# Patient Record
Sex: Female | Born: 1985 | Race: Black or African American | Hispanic: No | Marital: Single | Smoking: Former smoker
Health system: Southern US, Community
[De-identification: ages and names within clinical notes are randomized; demographics above are authoritative.]

## PROBLEM LIST (undated history)

## (undated) ENCOUNTER — Inpatient Hospital Stay (HOSPITAL_COMMUNITY): Payer: Self-pay

## (undated) DIAGNOSIS — I10 Essential (primary) hypertension: Secondary | ICD-10-CM

## (undated) DIAGNOSIS — O009 Unspecified ectopic pregnancy without intrauterine pregnancy: Secondary | ICD-10-CM

---
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
## 2006-11-30 ENCOUNTER — Encounter (INDEPENDENT_AMBULATORY_CARE_PROVIDER_SITE_OTHER): Payer: Self-pay | Admitting: Specialist

## 2006-11-30 ENCOUNTER — Inpatient Hospital Stay (HOSPITAL_COMMUNITY): Admission: AD | Admit: 2006-11-30 | Discharge: 2006-12-02 | Payer: Self-pay | Admitting: Obstetrics and Gynecology

## 2008-06-27 ENCOUNTER — Other Ambulatory Visit: Admission: RE | Admit: 2008-06-27 | Discharge: 2008-06-27 | Payer: Self-pay | Admitting: Obstetrics and Gynecology

## 2010-01-30 ENCOUNTER — Other Ambulatory Visit: Admission: RE | Admit: 2010-01-30 | Discharge: 2010-01-30 | Payer: Self-pay | Admitting: Obstetrics and Gynecology

## 2011-02-07 NOTE — Group Therapy Note (Signed)
[name], Cristina [name]               ACCOUNT NO.:  [id number]   MEDICAL RECORD NO.:  [id number]          PATIENT TYPE:  INP   LOCATION:  LDR3                          FACILITY:  [facility]   PHYSICIAN:  Tilda Burrow, M.[name]. DATE OF BIRTH:  07-06-1986   DATE OF PROCEDURE:  DATE OF DISCHARGE:                                 PROGRESS NOTE   DELIVERY SUMMARY:   ONSET OF LABOR:  November 30, 2006.   DATE OF DELIVERY:  November 30, 2006 at 1808 hours.   LENGTH OF FIRST STAGE OF LABOR:  Six hours and 48 minutes.   LENGTH OF SECOND STAGE OF LABOR:  Twenty minutes.   LENGTH OF THIRD STAGE OF LABOR:  Four minutes.   DELIVERY NOTE:  Cristina [name] had a vacuum-assisted delivery of a viable female  infant under epidural anesthesia for prolonged decelerations.  Infant  was delivered OP.  Upon delivery of head, nose and mouth were thoroughly  suctioned with a DeLee suction due to light meconium staining.  Cord was  reduced and infant delivered without any difficulties.  Apgar's were 9  and 9.  On inspection there is a second degree perineal laceration which  was repaired with 2-0 Vicryl under epidural anesthesia, per Dr.  Emelda Fear, with 2-0 Vicryl.  The third stage of labor was actively  managed with 20 units Pitocin and 1,000 cc of [name]-5 LR at a rapid rate.  Placenta was delivered spontaneously via Tomasa Blase mechanism.  Three  vessel cord was noted upon inspection.  Membranes were noted to be  intact upon  inspection.  Cord blood gas and cord blood was obtained.  Placenta sent  to pathology for evaluation due to meconium staining.  Estimated blood  loss was approximately 350 cc.  Epidural catheter was removed with blue  tip intact.  Infant and mother stabilized and transferred out to the  post partum unit in stable condition.      Cristina [name], Cristina [name]      Tilda Burrow, M.[name].  Electronically Signed    DL/MEDQ  [name]:  96/12/5407  T:  11/30/2006  Job:  [id number]   cc:   [facility]   Francoise Schaumann.  Milford Cage DO, FAAP  Fax: [phone]

## 2011-02-07 NOTE — Op Note (Signed)
NAME:  Cristina [name], Cristina [name]               ACCOUNT NO.:  [id number]   MEDICAL RECORD NO.:  [id number]          PATIENT TYPE:  INP   LOCATION:  LDR3                          FACILITY:  [facility]   PHYSICIAN:  Tilda Burrow, M.D. DATE OF BIRTH:  Aug 10, 1986   DATE OF PROCEDURE:  DATE OF DISCHARGE:                               OPERATIVE REPORT   Epidural catheter placement at 2 p.m. on the 10th.  Continuous lumbar  epidural catheter placed at L2-3 interspace  and attempted at L3-4  without success.  Patient at first been prepped and draped, sitting and  flexed forward with local anesthesia used at L3-4.  The bony interspace  could not be identified and bony obstruction of approximately 4-5 cm  depth encountered.  A wheal was placed at the interspace.  Our first  attempt identified the epidural space at 6 cm of depth.  We gave a 3 cc  injection of 0.5% Xylocaine with epinephrine.  This catheter _was  threaded 2 cm into___ the epidural space.  Gave an additional 3 cc test  dose of Xylocaine with epi and maternal blood pressure remained normal  and pulse stable.  The patient then had infusion of a bolus of 7 cc of  0.125% Marcaine solution followed by 14 cc per hour.  The patient had  steady analgesic effect associated with sitting up symmetrically and is  checked post procedure.  The cervix is 3 cm, 90%, -2, vertex well  applied to a fairly firm, _posterior__ cervix.   PROGNOSIS:  Routine vaginal delivery considered likely.      Tilda Burrow, M.D.  Electronically Signed     JVF/MEDQ  D:  11/30/2006  T:  11/30/2006  Job:  [id number]

## 2011-02-07 NOTE — Op Note (Signed)
NAMEJEANNETT, DEKONING               ACCOUNT NO.:  [id number]   MEDICAL RECORD NO.:  [id number]          PATIENT TYPE:  INP   LOCATION:  A401                          FACILITY:  [facility]   PHYSICIAN:  Tilda Burrow, M.D. DATE OF BIRTH:  Mar 04, 1986   DATE OF PROCEDURE:  11/30/2006  DATE OF DISCHARGE:  12/02/2006                               OPERATIVE REPORT   PROCEDURE:  Epidural catheter placement operative report.   DETAILS:  Epidural catheter placement at 2:00 p.m. on the 10th and  continuous lumbar epidural catheter placed at the L2-3 interspace after  an attempted at L3-4 was without success.  The patient had first been  prepped and draped, in the sitting position flexed forward with local  anesthesia used at L3-4, then again, at L2-3 when that was necessary.  The bony interspace at L3-4 could not be identified with bony  obstruction identified at 4-5 cm depth beneath the skin.  A wheal was  then placed at the L2-3 interspace; and the first attempt identified the  epidural space at 6 cm depth beneath the skin.   A 3 mL injection of 0.5% lidocaine with epinephrine was performed, then  the catheter threaded into the epidural space 3 cm.  There was an  additional 3 mL test dose of Xylocaine with epinephrine given; and  maternal blood pressure remained normal.  The patient then had infusion  of a 7 mL bolus of 0.125% Marcaine solution followed by continuous  infusion at 14 cc/hour.  The patient had symmetric analgesic effect.   Cervical exam after the epidural shows the cervix to be 3 cm 90%, minus  2 vertex presentation with the presenting part well applied to the  cervix.      Tilda Burrow, M.D.  Electronically Signed     JVF/MEDQ  D:  12/14/2006  T:  12/14/2006  Job:  [id number]

## 2011-02-07 NOTE — H&P (Signed)
NAME:  Cristina [name], Cristina [name]               ACCOUNT NO.:  [id number]   MEDICAL RECORD NO.:  [id number]          PATIENT TYPE:  INP   LOCATION:  LDR3                          FACILITY:  [facility]   PHYSICIAN:  Tilda Burrow, M.D. DATE OF BIRTH:  02-24-1986   DATE OF ADMISSION:  11/30/2006  DATE OF DISCHARGE:  LH                              HISTORY & PHYSICAL   REASON FOR ADMISSION:  Pregnancy at 39 weeks with spontaneous rupture of  membranes at 11:30 a.m. which was meconium stained.   PAST MEDICAL HISTORY:  Negative.   PAST SURGICAL HISTORY:  Negative.   FAMILY HISTORY:  Positive for hypertension and pancreatic cancer.   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  Blood  type is O positive.  UDS is negative.  Rubella immune.  Hepatitis B  surface antigen negative.  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  GC/Chlamydia negative.  A 28-week hemoglobin 11.2, 28-week  hematocrit 33.4, one hour glucose 134, sickle cell screen is negative.   PHYSICAL EXAMINATION:  VITAL SIGNS:  Stable.  CERVIX:  3 cm, 90% effaced, -1 station.  There is a moderate amount of  mucousy meconium noted with exam.  FSE and IUPC was applied without  difficulty, and we are going to start amnio infusion at 150 cc an hour  and antibiotics for GBS prophylaxis because GBS was not obtained, and  Pitocin augmentation of labor.      Zerita Boers, Lanier Clam      Tilda Burrow, M.D.  Electronically Signed   DL/MEDQ  D:  81/19/1478  T:  11/30/2006  Job:  [id number]   cc:   Francoise Schaumann. Raynelle Highland  Fax: [phone]   Tilda Burrow, M.D.  Fax: [phone]

## 2015-01-01 ENCOUNTER — Other Ambulatory Visit: Payer: Self-pay | Admitting: *Deleted

## 2015-01-01 ENCOUNTER — Ambulatory Visit (HOSPITAL_COMMUNITY): Payer: Medicaid Other | Admitting: Anesthesiology

## 2015-01-01 ENCOUNTER — Encounter (HOSPITAL_COMMUNITY): Payer: Self-pay | Admitting: *Deleted

## 2015-01-01 ENCOUNTER — Ambulatory Visit (HOSPITAL_COMMUNITY)
Admission: RE | Admit: 2015-01-01 | Discharge: 2015-01-01 | Disposition: A | Discharge status: Home / Self Care | Payer: Medicaid Other | Source: Ambulatory Visit | Attending: Obstetrics & Gynecology | Admitting: Obstetrics & Gynecology

## 2015-01-01 ENCOUNTER — Ambulatory Visit (INDEPENDENT_AMBULATORY_CARE_PROVIDER_SITE_OTHER): Payer: Medicaid Other

## 2015-01-01 ENCOUNTER — Encounter (HOSPITAL_COMMUNITY)
Admission: RE | Disposition: A | Discharge status: Home / Self Care | Payer: Self-pay | Source: Ambulatory Visit | Attending: Obstetrics & Gynecology

## 2015-01-01 ENCOUNTER — Ambulatory Visit (INDEPENDENT_AMBULATORY_CARE_PROVIDER_SITE_OTHER): Payer: Medicaid Other | Admitting: Obstetrics & Gynecology

## 2015-01-01 DIAGNOSIS — O00109 Unspecified tubal pregnancy without intrauterine pregnancy: Secondary | ICD-10-CM

## 2015-01-01 DIAGNOSIS — O009 Ectopic pregnancy, unspecified: Secondary | ICD-10-CM | POA: Diagnosis not present

## 2015-01-01 DIAGNOSIS — Z87891 Personal history of nicotine dependence: Secondary | ICD-10-CM | POA: Diagnosis not present

## 2015-01-01 DIAGNOSIS — O3680X1 Pregnancy with inconclusive fetal viability, fetus 1: Secondary | ICD-10-CM

## 2015-01-01 DIAGNOSIS — O001 Tubal pregnancy: Secondary | ICD-10-CM | POA: Diagnosis not present

## 2015-01-01 HISTORY — PX: LAPAROSCOPIC UNILATERAL SALPINGECTOMY: SHX5934

## 2015-01-01 LAB — CBC
HCT: 35.4 % — ABNORMAL LOW (ref 36.0–46.0)
Hemoglobin: 12.4 g/dL (ref 12.0–15.0)
MCH: 31.2 pg (ref 26.0–34.0)
MCHC: 35 g/dL (ref 30.0–36.0)
MCV: 89.2 fL (ref 78.0–100.0)
Platelets: 224 10*3/uL (ref 150–400)
RBC: 3.97 MIL/uL (ref 3.87–5.11)
RDW: 12.1 % (ref 11.5–15.5)
WBC: 6.7 10*3/uL (ref 4.0–10.5)

## 2015-01-01 LAB — TYPE AND SCREEN
ABO/RH(D): O POS
ANTIBODY SCREEN: NEGATIVE

## 2015-01-01 LAB — URINALYSIS, ROUTINE W REFLEX MICROSCOPIC
Bilirubin Urine: NEGATIVE
GLUCOSE, UA: NEGATIVE mg/dL
KETONES UR: NEGATIVE mg/dL
LEUKOCYTES UA: NEGATIVE
Nitrite: NEGATIVE
PH: 5.5 (ref 5.0–8.0)
PROTEIN: NEGATIVE mg/dL
UROBILINOGEN UA: 0.2 mg/dL (ref 0.0–1.0)

## 2015-01-01 LAB — COMPREHENSIVE METABOLIC PANEL
ALK PHOS: 56 U/L (ref 39–117)
ALT: 18 U/L (ref 0–35)
ANION GAP: 9 (ref 5–15)
AST: 17 U/L (ref 0–37)
Albumin: 3.8 g/dL (ref 3.5–5.2)
BILIRUBIN TOTAL: 0.5 mg/dL (ref 0.3–1.2)
BUN: 9 mg/dL (ref 6–23)
CO2: 23 mmol/L (ref 19–32)
Calcium: 9 mg/dL (ref 8.4–10.5)
Chloride: 103 mmol/L (ref 96–112)
Creatinine, Ser: 0.69 mg/dL (ref 0.50–1.10)
GFR calc Af Amer: >90 mL/min (ref >90)
GFR calc non Af Amer: >90 mL/min (ref >90)
Glucose, Bld: 118 mg/dL — ABNORMAL HIGH (ref 70–99)
Potassium: 3.3 mmol/L — ABNORMAL LOW (ref 3.5–5.1)
Sodium: 135 mmol/L (ref 135–145)
Total Protein: 7.4 g/dL (ref 6.0–8.3)

## 2015-01-01 LAB — URINE MICROSCOPIC-ADD ON

## 2015-01-01 SURGERY — SALPINGECTOMY, UNILATERAL, LAPAROSCOPIC
Anesthesia: General | Site: Abdomen | Laterality: Right

## 2015-01-01 MED ORDER — MIDAZOLAM HCL 5 MG/5ML IJ SOLN
INTRAMUSCULAR | Status: DC | PRN
  Administered 2015-01-01: 2 mg via INTRAVENOUS

## 2015-01-01 MED ORDER — ONDANSETRON HCL 4 MG/2ML IJ SOLN
4.0000 mg | Freq: Once | INTRAMUSCULAR | Status: AC
  Administered 2015-01-01: 4 mg via INTRAVENOUS

## 2015-01-01 MED ORDER — ONDANSETRON HCL 8 MG PO TABS: 8.0000 mg | ORAL_TABLET | Freq: Three times a day (TID) | ORAL | Status: DC | PRN

## 2015-01-01 MED ORDER — ROCURONIUM BROMIDE 50 MG/5ML IV SOLN
INTRAVENOUS | Status: AC
  Filled 2015-01-01: qty 1

## 2015-01-01 MED ORDER — BUPIVACAINE LIPOSOME 1.3 % IJ SUSP
INTRAMUSCULAR | Status: DC | PRN
  Administered 2015-01-01: 20 mL

## 2015-01-01 MED ORDER — KETOROLAC TROMETHAMINE 30 MG/ML IJ SOLN
30.0000 mg | Freq: Once | INTRAMUSCULAR | Status: AC
  Administered 2015-01-01: 30 mg via INTRAVENOUS

## 2015-01-01 MED ORDER — ONDANSETRON HCL 4 MG/2ML IJ SOLN: 4.0000 mg | Freq: Once | INTRAMUSCULAR | Status: AC | PRN

## 2015-01-01 MED ORDER — CEFAZOLIN SODIUM-DEXTROSE 2-3 GM-% IV SOLR
2.0000 g | INTRAVENOUS | Status: AC
  Administered 2015-01-01: 2 g via INTRAVENOUS

## 2015-01-01 MED ORDER — CEFAZOLIN SODIUM-DEXTROSE 2-3 GM-% IV SOLR
INTRAVENOUS | Status: AC
  Filled 2015-01-01: qty 50

## 2015-01-01 MED ORDER — KETOROLAC TROMETHAMINE 30 MG/ML IJ SOLN
INTRAMUSCULAR | Status: AC
  Filled 2015-01-01: qty 1

## 2015-01-01 MED ORDER — PROPOFOL 10 MG/ML IV BOLUS
INTRAVENOUS | Status: AC
  Filled 2015-01-01: qty 20

## 2015-01-01 MED ORDER — BUPIVACAINE LIPOSOME 1.3 % IJ SUSP
INTRAMUSCULAR | Status: AC
  Filled 2015-01-01: qty 20

## 2015-01-01 MED ORDER — NEOSTIGMINE METHYLSULFATE 10 MG/10ML IV SOLN
INTRAVENOUS | Status: DC | PRN
  Administered 2015-01-01: 3 mg via INTRAVENOUS

## 2015-01-01 MED ORDER — FENTANYL CITRATE 0.05 MG/ML IJ SOLN
INTRAMUSCULAR | Status: DC | PRN
  Administered 2015-01-01 (×3): 50 ug via INTRAVENOUS

## 2015-01-01 MED ORDER — OXYCODONE-ACETAMINOPHEN 5-325 MG PO TABS: 1.0000 | ORAL_TABLET | ORAL | Status: DC | PRN

## 2015-01-01 MED ORDER — GLYCOPYRROLATE 0.2 MG/ML IJ SOLN
INTRAMUSCULAR | Status: AC
  Filled 2015-01-01: qty 3

## 2015-01-01 MED ORDER — ROCURONIUM BROMIDE 100 MG/10ML IV SOLN
INTRAVENOUS | Status: DC | PRN
  Administered 2015-01-01: 30 mg via INTRAVENOUS
  Administered 2015-01-01: 5 mg via INTRAVENOUS

## 2015-01-01 MED ORDER — MIDAZOLAM HCL 2 MG/2ML IJ SOLN
INTRAMUSCULAR | Status: AC
  Filled 2015-01-01: qty 2

## 2015-01-01 MED ORDER — SODIUM CHLORIDE 0.9 % IR SOLN
Status: DC | PRN
  Administered 2015-01-01: 3000 mL

## 2015-01-01 MED ORDER — ONDANSETRON HCL 4 MG/2ML IJ SOLN
INTRAMUSCULAR | Status: AC
  Filled 2015-01-01: qty 2

## 2015-01-01 MED ORDER — GLYCOPYRROLATE 0.2 MG/ML IJ SOLN
INTRAMUSCULAR | Status: DC | PRN
  Administered 2015-01-01: 0.6 mg via INTRAVENOUS

## 2015-01-01 MED ORDER — LIDOCAINE HCL 1 % IJ SOLN
INTRAMUSCULAR | Status: DC | PRN
  Administered 2015-01-01: 20 mg via INTRADERMAL
  Administered 2015-01-01: 30 mg via INTRADERMAL

## 2015-01-01 MED ORDER — BUPIVACAINE LIPOSOME 1.3 % IJ SUSP: 20.0000 mL | Freq: Once | INTRAMUSCULAR | Status: DC

## 2015-01-01 MED ORDER — LIDOCAINE HCL (PF) 1 % IJ SOLN
INTRAMUSCULAR | Status: AC
  Filled 2015-01-01: qty 5

## 2015-01-01 MED ORDER — FENTANYL CITRATE 0.05 MG/ML IJ SOLN: 25.0000 ug | INTRAMUSCULAR | Status: DC | PRN

## 2015-01-01 MED ORDER — KETOROLAC TROMETHAMINE 10 MG PO TABS: 10.0000 mg | ORAL_TABLET | Freq: Three times a day (TID) | ORAL | Status: DC | PRN

## 2015-01-01 MED ORDER — LACTATED RINGERS IV SOLN
INTRAVENOUS | Status: DC
  Administered 2015-01-01 (×2): via INTRAVENOUS

## 2015-01-01 MED ORDER — MIDAZOLAM HCL 2 MG/2ML IJ SOLN
1.0000 mg | INTRAMUSCULAR | Status: DC | PRN
  Administered 2015-01-01 (×2): 2 mg via INTRAVENOUS
  Filled 2015-01-01: qty 2

## 2015-01-01 MED ORDER — PROPOFOL 10 MG/ML IV BOLUS
INTRAVENOUS | Status: DC | PRN
  Administered 2015-01-01: 20 mg via INTRAVENOUS
  Administered 2015-01-01: 30 mg via INTRAVENOUS
  Administered 2015-01-01: 150 mg via INTRAVENOUS

## 2015-01-01 MED ORDER — FENTANYL CITRATE 0.05 MG/ML IJ SOLN
INTRAMUSCULAR | Status: AC
  Filled 2015-01-01: qty 5

## 2015-01-01 MED ORDER — 0.9 % SODIUM CHLORIDE (POUR BTL) OPTIME
TOPICAL | Status: DC | PRN
  Administered 2015-01-01: 1000 mL

## 2015-01-01 MED ORDER — LIDOCAINE HCL (PF) 1 % IJ SOLN
INTRAMUSCULAR | Status: AC
  Filled 2015-01-01: qty 2

## 2015-01-01 SURGICAL SUPPLY — 46 items
BAG HAMPER (MISCELLANEOUS) ×3 IMPLANT
BAG SPEC RTRVL LRG 6X4 10 (ENDOMECHANICALS) ×1
BLADE 11 SAFETY STRL DISP (BLADE) ×1 IMPLANT
CLOTH BEACON ORANGE TIMEOUT ST (SAFETY) ×3 IMPLANT
COVER LIGHT HANDLE STERIS (MISCELLANEOUS) ×6 IMPLANT
DRAPE PROXIMA HALF (DRAPES) ×3 IMPLANT
ELECT REM PT RETURN 9FT ADLT (ELECTROSURGICAL) ×3
ELECTRODE REM PT RTRN 9FT ADLT (ELECTROSURGICAL) ×1 IMPLANT
FILTER SMOKE EVAC LAPAROSHD (FILTER) ×1 IMPLANT
FORMALIN 10 PREFIL 120ML (MISCELLANEOUS) ×2 IMPLANT
FORMALIN 10 PREFIL 480ML (MISCELLANEOUS) ×1 IMPLANT
GLOVE BIO SURGEON STRL SZ7 (GLOVE) ×2 IMPLANT
GLOVE BIOGEL PI IND STRL 7.0 (GLOVE) IMPLANT
GLOVE BIOGEL PI IND STRL 7.5 (GLOVE) IMPLANT
GLOVE BIOGEL PI IND STRL 8 (GLOVE) ×1 IMPLANT
GLOVE BIOGEL PI INDICATOR 7.0 (GLOVE) ×2
GLOVE BIOGEL PI INDICATOR 7.5 (GLOVE) ×2
GLOVE BIOGEL PI INDICATOR 8 (GLOVE) ×2
GLOVE ECLIPSE 8.0 STRL XLNG CF (GLOVE) ×3 IMPLANT
GOWN STRL REUS W/TWL LRG LVL3 (GOWN DISPOSABLE) ×6 IMPLANT
GOWN STRL REUS W/TWL XL LVL3 (GOWN DISPOSABLE) ×3 IMPLANT
INST SET LAPROSCOPIC GYN AP (KITS) ×3 IMPLANT
IV NS IRRIG 3000ML ARTHROMATIC (IV SOLUTION) ×3 IMPLANT
KIT ROOM TURNOVER APOR (KITS) ×3 IMPLANT
MANIFOLD NEPTUNE II (INSTRUMENTS) ×3 IMPLANT
NDL HYPO 21X1.5 SAFETY (NEEDLE) IMPLANT
NEEDLE HYPO 21X1.5 SAFETY (NEEDLE) ×3 IMPLANT
NEEDLE INSUFFLATION 120MM (ENDOMECHANICALS) ×3 IMPLANT
PACK PERI GYN (CUSTOM PROCEDURE TRAY) ×3 IMPLANT
PAD ARMBOARD 7.5X6 YLW CONV (MISCELLANEOUS) ×3 IMPLANT
POUCH SPECIMEN RETRIEVAL 10MM (ENDOMECHANICALS) ×2 IMPLANT
SCALPEL HARMONIC ACE (MISCELLANEOUS) ×3 IMPLANT
SET BASIN LINEN APH (SET/KITS/TRAYS/PACK) ×3 IMPLANT
SET TUBE IRRIG SUCTION NO TIP (IRRIGATION / IRRIGATOR) ×2 IMPLANT
SLEEVE ENDOPATH XCEL 5M (ENDOMECHANICALS) ×3 IMPLANT
SOLUTION ANTI FOG 6CC (MISCELLANEOUS) ×3 IMPLANT
STAPLER VISISTAT 35W (STAPLE) ×3 IMPLANT
SUT VICRYL 0 UR6 27IN ABS (SUTURE) ×3 IMPLANT
SYRINGE 10CC LL (SYRINGE) ×3 IMPLANT
SYRINGE 20CC LL (MISCELLANEOUS) ×2 IMPLANT
TAPE CLOTH SURG 4X10 WHT LF (GAUZE/BANDAGES/DRESSINGS) ×2 IMPLANT
TRAY FOLEY CATH 16FR SILVER (SET/KITS/TRAYS/PACK) ×3 IMPLANT
TROCAR XCEL NON-BLD 11X100MML (ENDOMECHANICALS) ×3 IMPLANT
TROCAR XCEL NON-BLD 5MMX100MML (ENDOMECHANICALS) ×3 IMPLANT
TUBING HI FLO HEAT INSUFFLATOR (IRRIGATION / IRRIGATOR) ×3 IMPLANT
WARMER LAPAROSCOPE (MISCELLANEOUS) ×3 IMPLANT

## 2015-01-01 NOTE — Anesthesia Preprocedure Evaluation (Addendum)
Anesthesia Evaluation  Patient identified by MRN, date of birth, ID band Patient awake    Reviewed: Allergy & Precautions, NPO status , Patient's Chart, lab work & pertinent test results  Airway Mallampati: I  TM Distance: >3 FB     Dental  (+) Teeth Intact   Pulmonary neg pulmonary ROS, former smoker,  breath sounds clear to auscultation        Cardiovascular negative cardio ROS  Rhythm:Regular Rate:Normal     Neuro/Psych    GI/Hepatic negative GI ROS,   Endo/Other    Renal/GU      Musculoskeletal   Abdominal   Peds  Hematology   Anesthesia Other Findings Solid food 3 hrs ago  Reproductive/Obstetrics                            Anesthesia Physical Anesthesia Plan  ASA: I and emergent  Anesthesia Plan: General   Post-op Pain Management:    Induction: Intravenous, Rapid sequence and Cricoid pressure planned  Airway Management Planned: Oral ETT  Additional Equipment:   Intra-op Plan:   Post-operative Plan: Extubation in OR  Informed Consent: I have reviewed the patients History and Physical, chart, labs and discussed the procedure including the risks, benefits and alternatives for the proposed anesthesia with the patient or authorized representative who has indicated his/her understanding and acceptance.     Plan Discussed with:   Anesthesia Plan Comments:         Anesthesia Quick Evaluation

## 2015-01-01 NOTE — H&P (Signed)
Preoperative History and Physical  Cristina [name] is a 29 y.o. No obstetric history on file. with Patient's last menstrual period was 11/06/2014 (exact date). admitted for a laparoscopic removal of a viable 7 week 5 day  right ectopic pregnancy.  unruptured  PMH:   History reviewed. No pertinent past medical history.  PSH:    History reviewed. No pertinent past surgical history.  POb/GynH:      OB History    No data available    G3 P1021  SH:   History  Substance Use Topics  . Smoking status: Former Smoker    Types: Cigarettes    Quit date: 12/13/2014  . Smokeless tobacco: Not on file  . Alcohol Use: No    FH:   History reviewed. No pertinent family history.   Allergies: Not on File  Medications:       Current facility-administered medications:  .  bupivacaine liposome (EXPAREL) 1.3 % injection 266 mg, 20 mL, Infiltration, Once, Lazaro Arms, MD .  Melene Muller ON 01/02/2015] ceFAZolin (ANCEF) IVPB 2 g/50 mL premix, 2 g, Intravenous, On Call to OR, Lazaro Arms, MD .  lactated ringers infusion, , Intravenous, Continuous, Laurene Footman, MD, Last Rate: 75 mL/hr at 01/01/15 1340 .  midazolam (VERSED) injection 1-2 mg, 1-2 mg, Intravenous, Q5 Min x 3 PRN, Laurene Footman, MD, 2 mg at 01/01/15 1342  Review of Systems:   Review of Systems  Constitutional: Negative for fever, chills, weight loss, malaise/fatigue and diaphoresis.  HENT: Negative for hearing loss, ear pain, nosebleeds, congestion, sore throat, neck pain, tinnitus and ear discharge.   Eyes: Negative for blurred vision, double vision, photophobia, pain, discharge and redness.  Respiratory: Negative for cough, hemoptysis, sputum production, shortness of breath, wheezing and stridor.   Cardiovascular: Negative for chest pain, palpitations, orthopnea, claudication, leg swelling and PND.  Gastrointestinal: Positive for abdominal pain. Negative for heartburn, nausea, vomiting, diarrhea, constipation, blood in stool and  melena.  Genitourinary: Negative for dysuria, urgency, frequency, hematuria and flank pain.  Musculoskeletal: Negative for myalgias, back pain, joint pain and falls.  Skin: Negative for itching and rash.  Neurological: Negative for dizziness, tingling, tremors, sensory change, speech change, focal weakness, seizures, loss of consciousness, weakness and headaches.  Endo/Heme/Allergies: Negative for environmental allergies and polydipsia. Does not bruise/bleed easily.  Psychiatric/Behavioral: Negative for depression, suicidal ideas, hallucinations, memory loss and substance abuse. The patient is not nervous/anxious and does not have insomnia.      PHYSICAL EXAM:  Temperature 98 F (36.7 C), temperature source Oral, height  (1.6 m), weight 165 lb (74.844 kg), last menstrual period 11/06/2014.    Vitals reviewed. Constitutional: She is oriented to person, place, and time. She appears well-developed and well-nourished.  HENT:  Head: Normocephalic and atraumatic.  Right Ear: External ear normal.  Left Ear: External ear normal.  Nose: Nose normal.  Mouth/Throat: Oropharynx is clear and moist.  Eyes: Conjunctivae and EOM are normal. Pupils are equal, round, and reactive to light. Right eye exhibits no discharge. Left eye exhibits no discharge. No scleral icterus.  Neck: Normal range of motion. Neck supple. No tracheal deviation present. No thyromegaly present.  Cardiovascular: Normal rate, regular rhythm, normal heart sounds and intact distal pulses.  Exam reveals no gallop and no friction rub.   No murmur heard. Respiratory: Effort normal and breath sounds normal. No respiratory distress. She has no wheezes. She has no rales. She exhibits no tenderness.  GI: Soft. Bowel sounds are normal. She exhibits  no distension and no mass. There is tenderness. There is no rebound and no guarding.  Genitourinary:       Vulva is normal without lesions Vagina is pink moist without discharge Cervix  normal in appearance and pap is normal Uterus is normal size, contour, position, consistency, mobility, non-tender Adnexa is negative with normal sized ovaries by sonogram  Musculoskeletal: Normal range of motion. She exhibits no edema and no tenderness.  Neurological: She is alert and oriented to person, place, and time. She has normal reflexes. She displays normal reflexes. No cranial nerve deficit. She exhibits normal muscle tone. Coordination normal.  Skin: Skin is warm and dry. No rash noted. No erythema. No pallor.  Psychiatric: She has a normal mood and affect. Her behavior is normal. Judgment and thought content normal.    Labs: Results for orders placed or performed during the hospital encounter of 01/16/2015 (from the past 336 hour(s))  CBC   Collection Time: 01/16/15  1:05 PM  Result Value Ref Range   WBC 6.7 4.0 - 10.5 K/uL   RBC 3.97 3.87 - 5.11 MIL/uL   Hemoglobin 12.4 12.0 - 15.0 g/dL   HCT 47.8 (L) 29.5 - 62.1 %   MCV 89.2 78.0 - 100.0 fL   MCH 31.2 26.0 - 34.0 pg   MCHC 35.0 30.0 - 36.0 g/dL   RDW 30.8 65.7 - 84.6 %   Platelets 224 150 - 400 K/uL  Comprehensive metabolic panel   Collection Time: 16-Jan-2015  1:05 PM  Result Value Ref Range   Sodium 135 135 - 145 mmol/L   Potassium 3.3 (L) 3.5 - 5.1 mmol/L   Chloride 103 96 - 112 mmol/L   CO2 23 19 - 32 mmol/L   Glucose, Bld 118 (H) 70 - 99 mg/dL   BUN 9 6 - 23 mg/dL   Creatinine, Ser 9.62 0.50 - 1.10 mg/dL   Calcium 9.0 8.4 - 95.2 mg/dL   Total Protein 7.4 6.0 - 8.3 g/dL   Albumin 3.8 3.5 - 5.2 g/dL   AST 17 0 - 37 U/L   ALT 18 0 - 35 U/L   Alkaline Phosphatase 56 39 - 117 U/L   Total Bilirubin 0.5 0.3 - 1.2 mg/dL   GFR calc non Af Amer >90 >90 mL/min   GFR calc Af Amer >90 >90 mL/min   Anion gap 9 5 - 15  Urinalysis, Routine w reflex microscopic   Collection Time: 2015/01/16  1:05 PM  Result Value Ref Range   Color, Urine YELLOW YELLOW   APPearance CLEAR CLEAR   Specific Gravity, Urine >1.030 (H) 1.005 -  1.030   pH 5.5 5.0 - 8.0   Glucose, UA NEGATIVE NEGATIVE mg/dL   Hgb urine dipstick TRACE (A) NEGATIVE   Bilirubin Urine NEGATIVE NEGATIVE   Ketones, ur NEGATIVE NEGATIVE mg/dL   Protein, ur NEGATIVE NEGATIVE mg/dL   Urobilinogen, UA 0.2 0.0 - 1.0 mg/dL   Nitrite NEGATIVE NEGATIVE   Leukocytes, UA NEGATIVE NEGATIVE  Urine microscopic-add on   Collection Time: 2015-01-16  1:05 PM  Result Value Ref Range   Squamous Epithelial / LPF RARE RARE   WBC, UA 0-2 <3 WBC/hpf   RBC / HPF 0-2 <3 RBC/hpf   Bacteria, UA MANY (A) RARE    EKG: No orders found for this or any previous visit.  Imaging Studies: US Ob Comp Less 14 Wks  01-16-15   DATING AND VIABILITY SONOGRAM   Cristina [name] is a 29 y.o. year old  with LMP of 11/06/2013 which would  correlate to  [redacted] weeks gestation.  She has regular menstrual cycles.   She  is here today for a confirmatory initial sonogram.    GESTATION: SINGLETON      FETAL ACTIVITY:          Heart rate         158  ADNEXA: The ovaries are normal.  Ectopic pregnancy in rt adnexa.   GESTATIONAL AGE AND  BIOMETRICS:  Gestational criteria: Estimated Date of Delivery: 08/15/2015 by LMP now at  31725w5d  Previous Scans:0  GESTATIONAL SAC    CROWN RUMP LENGTH           1.4 mm         7+5 weeks                                                                               AVERAGE EGA(BY THIS SCAN):   7+5 weeks  WORKING EDD( LMP ):  08/13/2015     TECHNICIAN COMMENTS:  US  [id number]  Ectopic pregnancy rt adnexa 3.6 x 2.7 x 3.34 cm ,fht 158bpm,w  ys,crl 1.4cm.small amount of cul de sac fluid,normal ov's bil,complex  fluid in endometrium,pt was seen by Dr.Eure        A copy of this report including all images has been saved and backed up to  a second source for retrieval if needed. All measures and details of the  anatomical scan, placentation, fluid volume and pelvic anatomy are  contained in that report.  Amber Flora LippsJ Carl 01/01/2015 12:13 PM   7 weeks 5 days viable unruptured right ectopic pregnancy   EURE,LUTHER H 01/01/2015 2:09 PM      Assessment: Right ectopic pregnancy unruptured viable 7 weeks 5 days  Plan: Laparoscopic removal of ectopic with salpingectomy  EURE,LUTHER H 01/01/2015 2:10 PM

## 2015-01-01 NOTE — Progress Notes (Signed)
Patient ID: Cristina [name], female   DOB: 18-Mar-1986, 29 y.o.   MRN: [id number] Preoperative History and Physical  Preoperative History and Physical  Cristina [name] is a 29 y.o. No obstetric history on file. with Patient's last menstrual period was 11/06/2014 (exact date). admitted for a laparoscopic removal of a viable 7 week 5 day right ectopic pregnancy.  unruptured  PMH: History reviewed. No pertinent past medical history.  PSH: History reviewed. No pertinent past surgical history.  POb/GynH:  OB History    No data available    G3 P1021  SH:  History  Substance Use Topics  . Smoking status: Former Smoker    Types: Cigarettes    Quit date: 12/13/2014  . Smokeless tobacco: Not on file  . Alcohol Use: No    FH: History reviewed. No pertinent family history.   Allergies: Not on File  Medications:  Current facility-administered medications:  . bupivacaine liposome (EXPAREL) 1.3 % injection 266 mg, 20 mL, Infiltration, Once, Lazaro Arms, MD . Melene Muller ON 01/02/2015] ceFAZolin (ANCEF) IVPB 2 g/50 mL premix, 2 g, Intravenous, On Call to OR, Lazaro Arms, MD . lactated ringers infusion, , Intravenous, Continuous, Laurene Footman, MD, Last Rate: 75 mL/hr at 01/01/15 1340 . midazolam (VERSED) injection 1-2 mg, 1-2 mg, Intravenous, Q5 Min x 3 PRN, Laurene Footman, MD, 2 mg at 01/01/15 1342  Review of Systems:   Review of Systems  Constitutional: Negative for fever, chills, weight loss, malaise/fatigue and diaphoresis.  HENT: Negative for hearing loss, ear pain, nosebleeds, congestion, sore throat, neck pain, tinnitus and ear discharge.  Eyes: Negative for blurred vision, double vision, photophobia, pain, discharge and redness.  Respiratory: Negative for cough, hemoptysis, sputum production, shortness of breath, wheezing and stridor.  Cardiovascular: Negative for chest pain, palpitations, orthopnea, claudication, leg swelling and  PND.  Gastrointestinal: Positive for abdominal pain. Negative for heartburn, nausea, vomiting, diarrhea, constipation, blood in stool and melena.  Genitourinary: Negative for dysuria, urgency, frequency, hematuria and flank pain.  Musculoskeletal: Negative for myalgias, back pain, joint pain and falls.  Skin: Negative for itching and rash.  Neurological: Negative for dizziness, tingling, tremors, sensory change, speech change, focal weakness, seizures, loss of consciousness, weakness and headaches.  Endo/Heme/Allergies: Negative for environmental allergies and polydipsia. Does not bruise/bleed easily.  Psychiatric/Behavioral: Negative for depression, suicidal ideas, hallucinations, memory loss and substance abuse. The patient is not nervous/anxious and does not have insomnia.     PHYSICAL EXAM:  Temperature 98 F (36.7 C), temperature source Oral, height  (1.6 m), weight 165 lb (74.844 kg), last menstrual period 11/06/2014.   Vitals reviewed. Constitutional: She is oriented to person, place, and time. She appears well-developed and well-nourished.  HENT:  Head: Normocephalic and atraumatic.  Right Ear: External ear normal.  Left Ear: External ear normal.  Nose: Nose normal.  Mouth/Throat: Oropharynx is clear and moist.  Eyes: Conjunctivae and EOM are normal. Pupils are equal, round, and reactive to light. Right eye exhibits no discharge. Left eye exhibits no discharge. No scleral icterus.  Neck: Normal range of motion. Neck supple. No tracheal deviation present. No thyromegaly present.  Cardiovascular: Normal rate, regular rhythm, normal heart sounds and intact distal pulses. Exam reveals no gallop and no friction rub.  No murmur heard. Respiratory: Effort normal and breath sounds normal. No respiratory distress. She has no wheezes. She has no rales. She exhibits no tenderness.  GI: Soft. Bowel sounds are normal. She exhibits no distension and no mass. There is  tenderness.  There is no rebound and no guarding.  Genitourinary:   Vulva is normal without lesions Vagina is pink moist without discharge Cervix normal in appearance and pap is normal Uterus is normal size, contour, position, consistency, mobility, non-tender Adnexa is negative with normal sized ovaries by sonogram  Musculoskeletal: Normal range of motion. She exhibits no edema and no tenderness.  Neurological: She is alert and oriented to person, place, and time. She has normal reflexes. She displays normal reflexes. No cranial nerve deficit. She exhibits normal muscle tone. Coordination normal.  Skin: Skin is warm and dry. No rash noted. No erythema. No pallor.  Psychiatric: She has a normal mood and affect. Her behavior is normal. Judgment and thought content normal.    Labs: Results for orders placed or performed during the hospital encounter of 01/01/15 (from the past 336 hour(s))  CBC   Collection Time: 01/01/15 1:05 PM  Result Value Ref Range   WBC 6.7 4.0 - 10.5 K/uL   RBC 3.97 3.87 - 5.11 MIL/uL   Hemoglobin 12.4 12.0 - 15.0 g/dL   HCT 86.535.4 (L) 78.436.0 - 69.646.0 %   MCV 89.2 78.0 - 100.0 fL   MCH 31.2 26.0 - 34.0 pg   MCHC 35.0 30.0 - 36.0 g/dL   RDW 29.512.1 28.411.5 - 13.215.5 %   Platelets 224 150 - 400 K/uL  Comprehensive metabolic panel   Collection Time: 01/01/15 1:05 PM  Result Value Ref Range   Sodium 135 135 - 145 mmol/L   Potassium 3.3 (L) 3.5 - 5.1 mmol/L   Chloride 103 96 - 112 mmol/L   CO2 23 19 - 32 mmol/L   Glucose, Bld 118 (H) 70 - 99 mg/dL   BUN 9 6 - 23 mg/dL   Creatinine, Ser 4.400.69 0.50 - 1.10 mg/dL   Calcium 9.0 8.4 - 10.210.5 mg/dL   Total Protein 7.4 6.0 - 8.3 g/dL   Albumin 3.8 3.5 - 5.2 g/dL   AST 17 0 - 37 U/L   ALT 18 0 - 35 U/L   Alkaline Phosphatase 56 39 - 117 U/L   Total Bilirubin 0.5 0.3 - 1.2 mg/dL   GFR calc non Af Amer >90 >90 mL/min   GFR calc Af  Amer >90 >90 mL/min   Anion gap 9 5 - 15  Urinalysis, Routine w reflex microscopic   Collection Time: 01/01/15 1:05 PM  Result Value Ref Range   Color, Urine YELLOW YELLOW   APPearance CLEAR CLEAR   Specific Gravity, Urine >1.030 (H) 1.005 - 1.030   pH 5.5 5.0 - 8.0   Glucose, UA NEGATIVE NEGATIVE mg/dL   Hgb urine dipstick TRACE (A) NEGATIVE   Bilirubin Urine NEGATIVE NEGATIVE   Ketones, ur NEGATIVE NEGATIVE mg/dL   Protein, ur NEGATIVE NEGATIVE mg/dL   Urobilinogen, UA 0.2 0.0 - 1.0 mg/dL   Nitrite NEGATIVE NEGATIVE   Leukocytes, UA NEGATIVE NEGATIVE  Urine microscopic-add on   Collection Time: 01/01/15 1:05 PM  Result Value Ref Range   Squamous Epithelial / LPF RARE RARE   WBC, UA 0-2 <3 WBC/hpf   RBC / HPF 0-2 <3 RBC/hpf   Bacteria, UA MANY (A) RARE    EKG: No orders found for this or any previous visit.  Imaging Studies:  Imaging Results    [place] Ob Comp Less 14 Wks  01/01/2015 DATING AND VIABILITY SONOGRAM Cristina [name] is a 29 y.o. year old with LMP of 11/06/2013 which would correlate to [redacted] weeks gestation. She has  regular menstrual cycles. She is here today for a confirmatory initial sonogram. GESTATION: SINGLETON FETAL ACTIVITY: Heart rate 158 ADNEXA: The ovaries are normal. Ectopic pregnancy in rt adnexa. GESTATIONAL AGE AND BIOMETRICS: Gestational criteria: Estimated Date of Delivery: 08/15/2015 by LMP now at [redacted]w[redacted]d Previous Scans:0 GESTATIONAL SAC CROWN RUMP LENGTH 1.4 mm 7+5 weeks AVERAGE EGA(BY THIS SCAN): 7+5 weeks WORKING EDD( LMP ): 08/13/2015 TECHNICIAN COMMENTS: [place] [redacted]w[redacted]d Ectopic pregnancy rt adnexa 3.6 x 2.7 x 3.34 cm ,fht 158bpm,w ys,crl 1.4cm.small amount of cul de sac fluid,normal ov's bil,complex fluid in endometrium,pt was seen by  Dr.Eure A copy of this report including all images has been saved and backed up to a second source for retrieval if needed. All measures and details of the anatomical scan, placentation, fluid volume and pelvic anatomy are contained in that report. Cristina [name] 01/01/2015 12:13 PM 7 weeks 5 days viable unruptured right ectopic pregnancy EURE,LUTHER H 01/01/2015 2:09 PM       Assessment: Right ectopic pregnancy unruptured viable 7 weeks 5 days  Plan: Laparoscopic removal of ectopic with salpingectomy        EURE,LUTHER H 01/01/2015 12:13 PM

## 2015-01-01 NOTE — Anesthesia Procedure Notes (Signed)
Procedure Name: Intubation Date/Time: 01/01/2015 3:00 PM Performed by: Franco NonesYATES, Nekeya Briski S Pre-anesthesia Checklist: Patient identified, Patient being monitored, Timeout performed, Emergency Drugs available and Suction available Patient Re-evaluated:Patient Re-evaluated prior to inductionOxygen Delivery Method: Circle System Utilized Preoxygenation: Pre-oxygenation with 100% oxygen Intubation Type: IV induction, Rapid sequence and Cricoid Pressure applied Ventilation: Mask ventilation without difficulty Laryngoscope Size: Mac and 3 Grade View: Grade I Tube type: Oral Tube size: 7.0 mm Number of attempts: 1 Airway Equipment and Method: Stylet Placement Confirmation: ETT inserted through vocal cords under direct vision,  positive ETCO2 and breath sounds checked- equal and bilateral Secured at: 22 cm Tube secured with: Tape Dental Injury: Teeth and Oropharynx as per pre-operative assessment

## 2015-01-01 NOTE — Progress Notes (Signed)
US  [id number]  ectopic rt adnexa,fht 158bpm,w ys,crl 1.4cm.small amount of cul de sac fluid,normal ov's bil,complex fluid in endometrium,pt was seen by Dr.Eure

## 2015-01-01 NOTE — Transfer of Care (Signed)
Immediate Anesthesia Transfer of Care Note  Patient: Cristina SentersAdrienne D [name]  Procedure(s) Performed: Procedure(s) (LRB): LAPAROSCOPIC REMOVAL OF RIGHT FALLOPIAN TUBE AND ECTOPIC PREGNANCY  (Right)  Patient Location: PACU  Anesthesia Type: General  Level of Consciousness: awake  Airway & Oxygen Therapy: Patient Spontanous Breathing and non-rebreather face mask  Post-op Assessment: Report given to PACU RN, Post -op Vital signs reviewed and stable and Patient moving all extremities  Post vital signs: Reviewed and stable  Complications: No apparent anesthesia complications

## 2015-01-01 NOTE — Anesthesia Postprocedure Evaluation (Signed)
Anesthesia Post Note  Patient: Cristina [name]  Procedure(s) Performed: Procedure(s) (LRB): LAPAROSCOPIC REMOVAL OF RIGHT FALLOPIAN TUBE AND ECTOPIC PREGNANCY  (Right)  Anesthesia type: General  Patient location: PACU  Post pain: Pain level controlled  Post assessment: Post-op Vital signs reviewed, Patient's Cardiovascular Status Stable, Respiratory Function Stable, Patent Airway, No signs of Nausea or vomiting and Pain level controlled    Post vital signs: Reviewed and stable  Level of consciousness: awake and alert   Complications: No apparent anesthesia complications

## 2015-01-01 NOTE — Discharge Instructions (Signed)
°Ectopic Pregnancy °An ectopic pregnancy is when the fertilized egg attaches (implants) outside the uterus. Most ectopic pregnancies occur in the fallopian tube. Rarely do ectopic pregnancies occur on the ovary, intestine, pelvis, or cervix. In an ectopic pregnancy, the fertilized egg does not have the ability to develop into a normal, healthy baby.  °A ruptured ectopic pregnancy is one in which the fallopian tube gets torn or bursts and results in internal bleeding. Often there is intense abdominal pain, and sometimes, vaginal bleeding. Having an ectopic pregnancy can be life threatening. If left untreated, this dangerous condition can lead to a blood transfusion, abdominal surgery, or even death. °CAUSES  °Damage to the fallopian tubes is the suspected cause in most ectopic pregnancies.  °RISK FACTORS °Depending on your circumstances, the risk of having an ectopic pregnancy will vary. The level of risk can be divided into three categories. °High Risk °· You have gone through infertility treatment. °· You have had a previous ectopic pregnancy. °· You have had previous tubal surgery. °· You have had previous surgery to have the fallopian tubes tied (tubal ligation). °· You have tubal problems or diseases. °· You have been exposed to DES. DES is a medicine that was used until 1971 and had effects on babies whose mothers took the medicine. °· You become pregnant while using an intrauterine device (IUD) for birth control.  °Moderate Risk °· You have a history of infertility. °· You have a history of a sexually transmitted infection (STI). °· You have a history of pelvic inflammatory disease (PID). °· You have scarring from endometriosis. °· You have multiple sexual partners. °· You smoke.  °Low Risk °· You have had previous pelvic surgery. °· You use vaginal douching. °· You became sexually active before 30 years of age. °SIGNS AND SYMPTOMS  °An ectopic pregnancy should be suspected in anyone who has missed a period  and has abdominal pain or bleeding. °· You may experience normal pregnancy symptoms, such as: °¨ Nausea. °¨ Tiredness. °¨ Breast tenderness. °· Other symptoms may include: °¨ Pain with intercourse. °¨ Irregular vaginal bleeding or spotting. °¨ Cramping or pain on one side or in the lower abdomen. °¨ Fast heartbeat. °¨ Passing out while having a bowel movement. °· Symptoms of a ruptured ectopic pregnancy and internal bleeding may include: °¨ Sudden, severe pain in the abdomen and pelvis. °¨ Dizziness or fainting. °¨ Pain in the shoulder area. °DIAGNOSIS  °Tests that may be performed include: °· A pregnancy test. °· An ultrasound test. °· Testing the specific level of pregnancy hormone in the bloodstream. °· Taking a sample of uterus tissue (dilation and curettage, D&C). °· Surgery to perform a visual exam of the inside of the abdomen using a thin, lighted tube with a tiny camera on the end (laparoscope). °TREATMENT  °An injection of a medicine called methotrexate may be given. This medicine causes the pregnancy tissue to be absorbed. It is given if: °· The diagnosis is made early. °· The fallopian tube has not ruptured. °· You are considered to be a good candidate for the medicine. °Usually, pregnancy hormone blood levels are checked after methotrexate treatment. This is to be sure the medicine is effective. It may take 4-6 weeks for the pregnancy to be absorbed (though most pregnancies will be absorbed by 3 weeks). °Surgical treatment may be needed. A laparoscope may be used to remove the pregnancy tissue. If severe internal bleeding occurs, a cut (incision) may be made in the lower abdomen (laparotomy), and the ectopic   pregnancy is removed. This stops the bleeding. Part of the fallopian tube, or the whole tube, may be removed as well (salpingectomy). After surgery, pregnancy hormone tests may be done to be sure there is no pregnancy tissue left. You may receive a Rho (D) immune globulin shot if you are Rh negative  and the father is Rh positive, or if you do not know the Rh type of the father. This is to prevent problems with any future pregnancy. °SEEK IMMEDIATE MEDICAL CARE IF:  °You have any symptoms of an ectopic pregnancy. This is a medical emergency. °MAKE SURE YOU: °· Understand these instructions. °· Will watch your condition. °· Will get help right away if you are not doing well or get worse. °Document Released: 10/16/2004 Document Revised: 01/23/2014 Document Reviewed: 04/07/2013 °Exit[employer]® Patient Information ©2015 [employer]. This information is not intended to replace advice given to you by your health care provider. Make sure you discuss any questions you have with your health care provider. ° ° °

## 2015-01-01 NOTE — Op Note (Signed)
Preoperative diagnosis: Right ectopic pregnancy, viable, 7 weeks 5 days, unruptured  Postoperative diagnosis: SAA   Procedure: Laparoscopic removal of right ectopic pregnancy and rightsalpingectomy   Surgeon: Lazaro ArmsEURE,Saharra Santo H   Anesthesia: Gen. Endotracheal   Findings: Pt came in for routine initial ob visit and found to have an ectopic on the right side, 7 weeks 5 days, viable, unruptured.  Left Fallopian tube was normal, uterus and both ovaries were normal    Description of operation: Patient was taken to the operating room and placed in the supine position where she underwent general endotracheal anesthesia. She was placed in dorsal lithotomy position. She was prepped and draped in usual sterile fashion. Foley catheter was placed. Incision was made in the umbilicus and a varies needle was placed peritoneal cavity with one pass that difficulty. The peritoneal cavity was insufflated. A 1011 non-bladed trocar was placed using a video laparoscope under direct visualization without difficulty. An incision was made in the right  and left lower quadrant and 5 mm non-bladed trochars were placed in each site without difficulty under direct visualization.    The fallopian tube had not been ruptured. Harmonic scalpel was used and a salpingectomy was performed. There was good hemostasis. The pelvis was irrigated and hemostasis once again confirmed. The left fallopian tube was completely normal and there were no other intraperitoneal abnormalities appreciated.   The trochars were removed and the gas was allowed to escape from the abdomen. The 2-5 mm trocar sites were closed with staples and injected with a total of 10 cc of exparel. The umbilical fascia was closed with single 2-0 Vicryl suture and the subcutaneous tissue was also closed using Vicryl. The skin was closed using skin staples. 10 cc of expael was injected here as well.   The patient remained hemodynamically stable throughout the entire procedure  was awakened from anesthesia and taken to the recovery room in good stable condition with all counts being correct.   She received 2 g of Ancef and 30 mg of Toradol prophylactically. There was no real intraoperative blood loss only the hemoperitoneum which was appreciated and present upon peritoneal entry.   Lazaro ArmsURE,Elide Stalzer H 01/01/2015 3:47 PM

## 2015-01-02 ENCOUNTER — Telehealth: Payer: Self-pay | Admitting: Obstetrics & Gynecology

## 2015-01-02 ENCOUNTER — Encounter (HOSPITAL_COMMUNITY): Payer: Self-pay | Admitting: Obstetrics & Gynecology

## 2015-01-02 NOTE — Telephone Encounter (Signed)
Pt states had procedure for ectopic pregnancy, c/o brownish discharge, no odor, no fever, no c/o pain. Pt informed symptoms WNL, if any changes call our office back. Pt has post op 01/08/2015. Pt verbalized understanding.

## 2015-01-08 ENCOUNTER — Ambulatory Visit (INDEPENDENT_AMBULATORY_CARE_PROVIDER_SITE_OTHER): Payer: Medicaid Other | Admitting: Obstetrics & Gynecology

## 2015-01-08 ENCOUNTER — Encounter: Payer: Self-pay | Admitting: Obstetrics & Gynecology

## 2015-01-08 VITALS — BP 120/80 | HR 64 | Wt 171.0 lb

## 2015-01-08 DIAGNOSIS — Z8759 Personal history of other complications of pregnancy, childbirth and the puerperium: Secondary | ICD-10-CM

## 2015-01-08 DIAGNOSIS — Z9889 Other specified postprocedural states: Secondary | ICD-10-CM

## 2015-01-08 MED ORDER — DESOGESTREL-ETHINYL ESTRADIOL 0.15-30 MG-MCG PO TABS: 1.0000 | ORAL_TABLET | Freq: Every day | ORAL | Status: DC

## 2015-01-08 NOTE — Progress Notes (Signed)
Patient ID: Cristina [name], female   DOB: 09/27/1985, 29 y.o.   MRN: [id number]  HPI: Patient returns for routine postoperative follow-up having undergone laparoscopic salpingectomy for right ectopic pregnancy viable intact on 01/01/2015.  The patient's immediate postoperative recovery has been unremarkable. Since hospital discharge the patient reports no problems.   Current Outpatient Prescriptions: ketorolac (TORADOL) 10 MG tablet, Take 1 tablet (10 mg total) by mouth every 8 (eight) hours as needed., Disp: 15 tablet, Rfl: 0 oxyCODONE-acetaminophen (ROXICET) 5-325 MG per tablet, Take 1-2 tablets by mouth every 4 (four) hours as needed for severe pain., Disp: 30 tablet, Rfl: 0 acetaminophen (TYLENOL) 500 MG tablet, Take 500 mg by mouth every 6 (six) hours as needed for mild pain., Disp: , Rfl:  ketorolac (TORADOL) 10 MG tablet, Take 1 tablet (10 mg total) by mouth every 8 (eight) hours as needed. (Patient not taking: Reported on 01/08/2015), Disp: 15 tablet, Rfl: 0 ondansetron (ZOFRAN) 8 MG tablet, Take 1 tablet (8 mg total) by mouth every 8 (eight) hours as needed for nausea. (Patient not taking: Reported on 01/08/2015), Disp: 12 tablet, Rfl: 0 ondansetron (ZOFRAN) 8 MG tablet, Take 1 tablet (8 mg total) by mouth every 8 (eight) hours as needed for nausea. (Patient not taking: Reported on 01/08/2015), Disp: 12 tablet, Rfl: 0 oxyCODONE-acetaminophen (ROXICET) 5-325 MG per tablet, Take 1-2 tablets by mouth every 4 (four) hours as needed for severe pain. (Patient not taking: Reported on 01/08/2015), Disp: 30 tablet, Rfl: 0  No current facility-administered medications for this visit.    Blood pressure 120/80, pulse 64, weight 171 lb (77.565 kg), last menstrual period 11/06/2014.  Physical Exam: Abdomen soft normal post op Incisions x3 all look good staples removed  Diagnostic Tests: none  Pathology: Ectopic pregnancy  Impression: S/P laparoscopic right salpingectomy for a viable intact  right ectopic pregnancy  Plan: Begin OCP  Follow up: prn  months  Lazaro ArmsEURE,LUTHER H, MD

## 2015-02-05 ENCOUNTER — Telehealth: Payer: Self-pay | Admitting: *Deleted

## 2015-02-05 NOTE — Telephone Encounter (Signed)
Pt states she is having irregular bleeding with her birth control and had a Laparoscopic Removal of Right Fallopian Tube and Ectopic pregnancy on 01/01/2015. Pt informed when starting a new birth control can have some irregular bleeding for a few months. Can take time for birth control to get built up in her system and regulate her periods. Pt verbalized understanding.

## 2015-02-20 ENCOUNTER — Telehealth: Payer: Self-pay | Admitting: Obstetrics & Gynecology

## 2015-02-20 NOTE — Telephone Encounter (Signed)
Pt states had an ectopic pregnancy been taking PNV. Pt states should she change to a multivitamin or continue PNV, wants to become pregnant in near future. Pt advised if she had PNV and wants to become pregnant will not hurt to continue the PNV. Pt verbalized understanidng.

## 2015-09-23 NOTE — L&D Delivery Note (Signed)
Delivery Note At 2:53 PM a viable female was delivered via Vaginal, Spontaneous Delivery (Presentation: LOA).  APGAR: 8, 9; weight: pending.  Mild shoulder dystocia during del: suprapubic pressure used, then post shoulder released; no traction used on head. Timed at 45 sec. Placenta status: spont, intact .  Cord: 3 vessel  Anesthesia:  epidural Episiotomy: None Lacerations: 1st degree vag  Suture Repair: 3.0 monocryl- two interrupteds Est. Blood Loss (mL):  250  Mom to postpartum.  Baby to Couplet care / Skin to Skin.  Cam HaiSHAW, Jerimyah Vandunk CNM 06/06/2016, 3:19 PM

## 2015-10-16 ENCOUNTER — Other Ambulatory Visit: Payer: Self-pay | Admitting: Obstetrics & Gynecology

## 2015-10-16 DIAGNOSIS — O3680X0 Pregnancy with inconclusive fetal viability, not applicable or unspecified: Secondary | ICD-10-CM

## 2015-10-19 ENCOUNTER — Ambulatory Visit (INDEPENDENT_AMBULATORY_CARE_PROVIDER_SITE_OTHER): Payer: Medicaid Other

## 2015-10-19 DIAGNOSIS — Z3A01 Less than 8 weeks gestation of pregnancy: Secondary | ICD-10-CM | POA: Diagnosis not present

## 2015-10-19 DIAGNOSIS — O3680X0 Pregnancy with inconclusive fetal viability, not applicable or unspecified: Secondary | ICD-10-CM

## 2015-10-19 NOTE — Progress Notes (Signed)
[place] 7wks single IUP w/ys,pos fht 115 bpm,normal ov's bilat,crl 8.8 mm

## 2015-10-31 ENCOUNTER — Encounter: Payer: Self-pay | Admitting: Advanced Practice Midwife

## 2015-10-31 ENCOUNTER — Ambulatory Visit (INDEPENDENT_AMBULATORY_CARE_PROVIDER_SITE_OTHER): Payer: Medicaid Other | Admitting: Advanced Practice Midwife

## 2015-10-31 VITALS — BP 100/60 | HR 68 | Wt 163.5 lb

## 2015-10-31 DIAGNOSIS — Z369 Encounter for antenatal screening, unspecified: Secondary | ICD-10-CM

## 2015-10-31 DIAGNOSIS — Z331 Pregnant state, incidental: Secondary | ICD-10-CM

## 2015-10-31 DIAGNOSIS — Z3491 Encounter for supervision of normal pregnancy, unspecified, first trimester: Secondary | ICD-10-CM

## 2015-10-31 DIAGNOSIS — Z0283 Encounter for blood-alcohol and blood-drug test: Secondary | ICD-10-CM

## 2015-10-31 DIAGNOSIS — Z1389 Encounter for screening for other disorder: Secondary | ICD-10-CM

## 2015-10-31 DIAGNOSIS — Z3682 Encounter for antenatal screening for nuchal translucency: Secondary | ICD-10-CM

## 2015-10-31 DIAGNOSIS — Z3490 Encounter for supervision of normal pregnancy, unspecified, unspecified trimester: Secondary | ICD-10-CM | POA: Insufficient documentation

## 2015-10-31 LAB — POCT URINALYSIS DIPSTICK
Blood, UA: NEGATIVE
GLUCOSE UA: NEGATIVE
Ketones, UA: NEGATIVE
Leukocytes, UA: NEGATIVE
NITRITE UA: NEGATIVE
Protein, UA: NEGATIVE

## 2015-10-31 NOTE — Patient Instructions (Signed)
 First Trimester of Pregnancy The first trimester of pregnancy is from week 1 until the end of week 12 (months 1 through 3). A week after a sperm fertilizes an egg, the egg will implant on the wall of the uterus. This embryo will begin to develop into a baby. Genes from you and your partner are forming the baby. The female genes determine whether the baby is a boy or a girl. At 6-8 weeks, the eyes and face are formed, and the heartbeat can be seen on ultrasound. At the end of 12 weeks, all the baby's organs are formed.  Now that you are pregnant, you will want to do everything you can to have a healthy baby. Two of the most important things are to get good prenatal care and to follow your health care provider's instructions. Prenatal care is all the medical care you receive before the baby's birth. This care will help prevent, find, and treat any problems during the pregnancy and childbirth. BODY CHANGES Your body goes through many changes during pregnancy. The changes vary from woman to woman.   You may gain or lose a couple of pounds at first.  You may feel sick to your stomach (nauseous) and throw up (vomit). If the vomiting is uncontrollable, call your health care provider.  You may tire easily.  You may develop headaches that can be relieved by medicines approved by your health care provider.  You may urinate more often. Painful urination may mean you have a bladder infection.  You may develop heartburn as a result of your pregnancy.  You may develop constipation because certain hormones are causing the muscles that push waste through your intestines to slow down.  You may develop hemorrhoids or swollen, bulging veins (varicose veins).  Your breasts may begin to grow larger and become tender. Your nipples may stick out more, and the tissue that surrounds them (areola) may become darker.  Your gums may bleed and may be sensitive to brushing and flossing.  Dark spots or blotches  (chloasma, mask of pregnancy) may develop on your face. This will likely fade after the baby is born.  Your menstrual periods will stop.  You may have a loss of appetite.  You may develop cravings for certain kinds of food.  You may have changes in your emotions from day to day, such as being excited to be pregnant or being concerned that something may go wrong with the pregnancy and baby.  You may have more vivid and strange dreams.  You may have changes in your hair. These can include thickening of your hair, rapid growth, and changes in texture. Some women also have hair loss during or after pregnancy, or hair that feels dry or thin. Your hair will most likely return to normal after your baby is born. WHAT TO EXPECT AT YOUR PRENATAL VISITS During a routine prenatal visit:  You will be weighed to make sure you and the baby are growing normally.  Your blood pressure will be taken.  Your abdomen will be measured to track your baby's growth.  The fetal heartbeat will be listened to starting around week 10 or 12 of your pregnancy.  Test results from any previous visits will be discussed. Your health care provider may ask you:  How you are feeling.  If you are feeling the baby move.  If you have had any abnormal symptoms, such as leaking fluid, bleeding, severe headaches, or abdominal cramping.  If you have any questions. Other   tests that may be performed during your first trimester include:  Blood tests to find your blood type and to check for the presence of any previous infections. They will also be used to check for low iron levels (anemia) and Rh antibodies. Later in the pregnancy, blood tests for diabetes will be done along with other tests if problems develop.  Urine tests to check for infections, diabetes, or protein in the urine.  An ultrasound to confirm the proper growth and development of the baby.  An amniocentesis to check for possible genetic problems.  Fetal  screens for spina bifida and Down syndrome.  You may need other tests to make sure you and the baby are doing well. HOME CARE INSTRUCTIONS  Medicines  Follow your health care provider's instructions regarding medicine use. Specific medicines may be either safe or unsafe to take during pregnancy.  Take your prenatal vitamins as directed.  If you develop constipation, try taking a stool softener if your health care provider approves. Diet  Eat regular, well-balanced meals. Choose a variety of foods, such as meat or vegetable-based protein, fish, milk and low-fat dairy products, vegetables, fruits, and whole grain breads and cereals. Your health care provider will help you determine the amount of weight gain that is right for you.  Avoid raw meat and uncooked cheese. These carry germs that can cause birth defects in the baby.  Eating four or five small meals rather than three large meals a day may help relieve nausea and vomiting. If you start to feel nauseous, eating a few soda crackers can be helpful. Drinking liquids between meals instead of during meals also seems to help nausea and vomiting.  If you develop constipation, eat more high-fiber foods, such as fresh vegetables or fruit and whole grains. Drink enough fluids to keep your urine clear or pale yellow. Activity and Exercise  Exercise only as directed by your health care provider. Exercising will help you:  Control your weight.  Stay in shape.  Be prepared for labor and delivery.  Experiencing pain or cramping in the lower abdomen or low back is a good sign that you should stop exercising. Check with your health care provider before continuing normal exercises.  Try to avoid standing for long periods of time. Move your legs often if you must stand in one place for a long time.  Avoid heavy lifting.  Wear low-heeled shoes, and practice good posture.  You may continue to have sex unless your health care provider directs you  otherwise. Relief of Pain or Discomfort  Wear a good support bra for breast tenderness.   Take warm sitz baths to soothe any pain or discomfort caused by hemorrhoids. Use hemorrhoid cream if your health care provider approves.   Rest with your legs elevated if you have leg cramps or low back pain.  If you develop varicose veins in your legs, wear support hose. Elevate your feet for 15 minutes, 3-4 times a day. Limit salt in your diet. Prenatal Care  Schedule your prenatal visits by the twelfth week of pregnancy. They are usually scheduled monthly at first, then more often in the last 2 months before delivery.  Write down your questions. Take them to your prenatal visits.  Keep all your prenatal visits as directed by your health care provider. Safety  Wear your seat belt at all times when driving.  Make a list of emergency phone numbers, including numbers for family, friends, the hospital, and police and fire departments. General   Tips  Ask your health care provider for a referral to a local prenatal education class. Begin classes no later than at the beginning of month 6 of your pregnancy.  Ask for help if you have counseling or nutritional needs during pregnancy. Your health care provider can offer advice or refer you to specialists for help with various needs.  Do not use hot tubs, steam rooms, or saunas.  Do not douche or use tampons or scented sanitary pads.  Do not cross your legs for long periods of time.  Avoid cat litter boxes and soil used by cats. These carry germs that can cause birth defects in the baby and possibly loss of the fetus by miscarriage or stillbirth.  Avoid all smoking, herbs, alcohol, and medicines not prescribed by your health care provider. Chemicals in these affect the formation and growth of the baby.  Schedule a dentist appointment. At home, brush your teeth with a soft toothbrush and be gentle when you floss. SEEK MEDICAL CARE IF:   You have  dizziness.  You have mild pelvic cramps, pelvic pressure, or nagging pain in the abdominal area.  You have persistent nausea, vomiting, or diarrhea.  You have a bad smelling vaginal discharge.  You have pain with urination.  You notice increased swelling in your face, hands, legs, or ankles. SEEK IMMEDIATE MEDICAL CARE IF:   You have a fever.  You are leaking fluid from your vagina.  You have spotting or bleeding from your vagina.  You have severe abdominal cramping or pain.  You have rapid weight gain or loss.  You vomit blood or material that looks like coffee grounds.  You are exposed to German measles and have never had them.  You are exposed to fifth disease or chickenpox.  You develop a severe headache.  You have shortness of breath.  You have any kind of trauma, such as from a fall or a car accident. Document Released: 09/02/2001 Document Revised: 01/23/2014 Document Reviewed: 07/19/2013 [employer] Patient Information 2015 [employer]. This information is not intended to replace advice given to you by your health care provider. Make sure you discuss any questions you have with your health care provider.   Nausea & Vomiting  Have saltine crackers or pretzels by your bed and eat a few bites before you raise your head out of bed in the morning  Eat small frequent meals throughout the day instead of large meals  Drink plenty of fluids throughout the day to stay hydrated, just don't drink a lot of fluids with your meals.  This can make your stomach fill up faster making you feel sick  Do not brush your teeth right after you eat  Products with real ginger are good for nausea, like ginger ale and ginger hard candy Make sure it says made with real ginger!  Sucking on sour candy like lemon heads is also good for nausea  If your prenatal vitamins make you nauseated, take them at night so you will sleep through the nausea  Sea Bands  If you feel like you need  medicine for the nausea & vomiting please let us know  If you are unable to keep any fluids or food down please let us know   Constipation  Drink plenty of fluid, preferably water, throughout the day  Eat foods high in fiber such as fruits, vegetables, and grains  Exercise, such as walking, is a good way to keep your bowels regular  Drink warm fluids, especially warm   prune juice, or decaf coffee  Eat a 1/2 cup of real oatmeal (not instant), 1/2 cup applesauce, and 1/2-1 cup warm prune juice every day  If needed, you may take Colace (docusate sodium) stool softener once or twice a day to help keep the stool soft. If you are pregnant, wait until you are out of your first trimester (12-14 weeks of pregnancy)  If you still are having problems with constipation, you may take Miralax once daily as needed to help keep your bowels regular.  If you are pregnant, wait until you are out of your first trimester (12-14 weeks of pregnancy)  Safe Medications in Pregnancy   Acne: Benzoyl Peroxide Salicylic Acid  Backache/Headache: Tylenol: 2 regular strength every 4 hours OR              2 Extra strength every 6 hours  Colds/Coughs/Allergies: Benadryl (alcohol free) 25 mg every 6 hours as needed Breath right strips Claritin Cepacol throat lozenges Chloraseptic throat spray Cold-Eeze- up to three times per day Cough drops, alcohol free Flonase (by prescription only) Guaifenesin Mucinex Robitussin DM (plain only, alcohol free) Saline nasal spray/drops Sudafed (pseudoephedrine) & Actifed ** use only after [redacted] weeks gestation and if you do not have high blood pressure Tylenol Vicks Vaporub Zinc lozenges Zyrtec   Constipation: Colace Ducolax suppositories Fleet enema Glycerin suppositories Metamucil Milk of magnesia Miralax Senokot Smooth move tea  Diarrhea: Kaopectate Imodium A-D  *NO pepto Bismol  Hemorrhoids: Anusol Anusol HC Preparation  H Tucks  Indigestion: Tums Maalox Mylanta Zantac  Pepcid  Insomnia: Benadryl (alcohol free) 25mg every 6 hours as needed Tylenol PM Unisom, no Gelcaps  Leg Cramps: Tums MagGel  Nausea/Vomiting:  Bonine Dramamine Emetrol Ginger extract Sea bands Meclizine  Nausea medication to take during pregnancy:  Unisom (doxylamine succinate 25 mg tablets) Take one tablet daily at bedtime. If symptoms are not adequately controlled, the dose can be increased to a maximum recommended dose of two tablets daily (1/2 tablet in the morning, 1/2 tablet mid-afternoon and one at bedtime). Vitamin B6 100mg tablets. Take one tablet twice a day (up to 200 mg per day).  Skin Rashes: Aveeno products Benadryl cream or 25mg every 6 hours as needed Calamine Lotion 1% cortisone cream  Yeast infection: Gyne-lotrimin 7 Monistat 7   **If taking multiple medications, please check labels to avoid duplicating the same active ingredients **take medication as directed on the label ** Do not exceed 4000 mg of tylenol in 24 hours **Do not take medications that contain aspirin or ibuprofen      

## 2015-10-31 NOTE — Progress Notes (Signed)
  Subjective:    Cristina [name] is a G1P0 [redacted]w[redacted]d being seen today for her first obstetrical visit.  Her obstetrical history is significant for ectopic w/removal of right tube 4/16.Marland Kitchen 1 TAB, and a full term SVD without problems.  Pregnancy history fully reviewed.  Patient reports no complaints  Has a lot of questions and worries.  Pt reassured.  Filed Vitals:   10/31/15 1133  BP: 100/60  Pulse: 68  Weight: 163 lb 8 oz (74.163 kg)    HISTORY: OB History  Gravida Para Term Preterm AB SAB TAB Ectopic Multiple Living  # Outcome Date GA Lbr Len/2nd Weight Sex Delivery Anes PTL Lv  4 Current           3 Ectopic 11/11/14          2 Term 11/30/06 [redacted]w[redacted]d   M Vag-Spont EPI N Y  1 AB 09/22/04             History reviewed. No pertinent past medical history. Past Surgical History  Procedure Laterality Date  . Laparoscopic unilateral salpingectomy Right 01/01/2015    Procedure: LAPAROSCOPIC REMOVAL OF RIGHT FALLOPIAN TUBE AND ECTOPIC PREGNANCY ;  Surgeon: Lazaro Arms, MD;  Location: [facility];  Service: Gynecology;  Laterality: Right;   Family History  Problem Relation Age of Onset  . Diabetes Mother   . Cancer Maternal Grandmother     pancreatic  . Hypertension Maternal Grandmother      Exam                                           Skin: normal coloration and turgor, no rashes    Neurologic: oriented, normal, normal mood   Extremities: normal strength, tone, and muscle mass   HEENT PERRLA   Mouth/Teeth mucous membranes moist, normal dentition   Neck supple and no masses   Cardiovascular: regular rate and rhythm   Respiratory:  appears well, vitals normal, no respiratory distress, acyanotic   Abdomen: soft, non-tender;  FHR: 160 [place]          Assessment:    Pregnancy: G1P0 Patient Active Problem List   Diagnosis Date Noted  . Supervision of normal pregnancy 10/31/2015        Plan:     Initial labs drawn. Continue prenatal vitamins   Problem list reviewed and updated  Reviewed n/v relief measures and warning s/s to report  Reviewed recommended weight gain based on pre-gravid BMI  Encouraged well-balanced diet Genetic Screening discussed Integrated Screen: declined  Ultrasound discussed; fetal survey: requested.  Return in about 4 weeks (around 11/28/2015) for LROB.  CRESENZO-DISHMAN,Gawain Crombie 10/31/2015

## 2015-11-01 LAB — GC/CHLAMYDIA PROBE AMP
CHLAMYDIA, DNA PROBE: NEGATIVE
Neisseria gonorrhoeae by PCR: NEGATIVE

## 2015-11-01 LAB — URINE CULTURE

## 2015-11-08 LAB — CBC
HEMOGLOBIN: 13.7 g/dL (ref 11.1–15.9)
Hematocrit: 39.7 % (ref 34.0–46.6)
MCH: 31 pg (ref 26.6–33.0)
MCHC: 34.5 g/dL (ref 31.5–35.7)
MCV: 90 fL (ref 79–97)
Platelets: 258 10*3/uL (ref 150–379)
RBC: 4.42 x10E6/uL (ref 3.77–5.28)
RDW: 13.4 % (ref 12.3–15.4)
WBC: 6.1 10*3/uL (ref 3.4–10.8)

## 2015-11-08 LAB — PMP SCREEN PROFILE (10S), URINE
Amphetamine Screen, Ur: NEGATIVE ng/mL
BENZODIAZEPINE SCREEN, URINE: NEGATIVE ng/mL
Barbiturate Screen, Ur: NEGATIVE ng/mL
CREATININE(CRT), U: 101.6 mg/dL (ref 20.0–300.0)
Cannabinoids Ur Ql Scn: NEGATIVE ng/mL
Cocaine(Metab.)Screen, Urine: NEGATIVE ng/mL
METHADONE SCREEN, URINE: NEGATIVE ng/mL
OPIATE SCRN UR: NEGATIVE ng/mL
OXYCODONE+OXYMORPHONE UR QL SCN: NEGATIVE ng/mL
PCP SCRN UR: NEGATIVE ng/mL
PROPOXYPHENE SCREEN: NEGATIVE ng/mL
Ph of Urine: 6.4 (ref 4.5–8.9)

## 2015-11-08 LAB — URINALYSIS, ROUTINE W REFLEX MICROSCOPIC
Bilirubin, UA: NEGATIVE
Glucose, UA: NEGATIVE
Ketones, UA: NEGATIVE
Leukocytes, UA: NEGATIVE
Nitrite, UA: NEGATIVE
PH UA: 6.5 (ref 5.0–7.5)
Protein, UA: NEGATIVE
RBC, UA: NEGATIVE
Specific Gravity, UA: 1.021 (ref 1.005–1.030)
UUROB: 0.2 mg/dL (ref 0.2–1.0)

## 2015-11-08 LAB — ANTIBODY SCREEN: ANTIBODY SCREEN: NEGATIVE

## 2015-11-08 LAB — CYSTIC FIBROSIS MUTATION 97: GENE DIS ANAL CARRIER INTERP BLD/T-IMP: NOT DETECTED

## 2015-11-08 LAB — SICKLE CELL SCREEN: Sickle Cell Screen: NEGATIVE

## 2015-11-08 LAB — RPR: RPR Ser Ql: NONREACTIVE

## 2015-11-08 LAB — HIV ANTIBODY (ROUTINE TESTING W REFLEX): HIV Screen 4th Generation wRfx: NONREACTIVE

## 2015-11-08 LAB — ABO/RH: Rh Factor: POSITIVE

## 2015-11-08 LAB — RUBELLA SCREEN: Rubella Antibodies, IGG: 3.2 index (ref >0.99)

## 2015-11-08 LAB — HEPATITIS B SURFACE ANTIGEN: HEP B S AG: NEGATIVE

## 2015-11-08 LAB — VARICELLA ZOSTER ANTIBODY, IGG: Varicella zoster IgG: >4000 index (ref >165)

## 2015-11-16 ENCOUNTER — Telehealth: Payer: Self-pay | Admitting: *Deleted

## 2015-11-16 MED ORDER — PRENATAL PLUS 27-1 MG PO TABS: ORAL_TABLET | ORAL | Status: DC

## 2015-11-16 NOTE — Telephone Encounter (Signed)
Prenatal vitamins called to [facility] per pt request.

## 2015-11-28 ENCOUNTER — Ambulatory Visit (INDEPENDENT_AMBULATORY_CARE_PROVIDER_SITE_OTHER): Payer: Medicaid Other | Admitting: Advanced Practice Midwife

## 2015-11-28 VITALS — BP 132/78 | HR 70 | Wt 162.0 lb

## 2015-11-28 DIAGNOSIS — Z3491 Encounter for supervision of normal pregnancy, unspecified, first trimester: Secondary | ICD-10-CM

## 2015-11-28 DIAGNOSIS — Z1389 Encounter for screening for other disorder: Secondary | ICD-10-CM

## 2015-11-28 DIAGNOSIS — Z331 Pregnant state, incidental: Secondary | ICD-10-CM

## 2015-11-28 LAB — POCT URINALYSIS DIPSTICK
Blood, UA: NEGATIVE
GLUCOSE UA: NEGATIVE
Ketones, UA: NEGATIVE
Leukocytes, UA: NEGATIVE
Nitrite, UA: NEGATIVE
PROTEIN UA: NEGATIVE

## 2015-11-28 NOTE — Patient Instructions (Signed)

## 2015-11-28 NOTE — Progress Notes (Signed)
[id number] [id number] Estimated Date of Delivery: 06/06/16  Blood pressure 132/78, pulse 70, weight 162 lb (73.483 kg), last menstrual period 08/31/2015.   BP weight and urine results all reviewed and noted.  Please refer to the obstetrical flow sheet for the fundal height and fetal heart rate documentation:  Patient denies any bleeding and no rupture of membranes symptoms or regular contractions. Patient is without complaints. Feels pretty good All questions were answered.  Orders Placed This Encounter  Procedures  . POCT urinalysis dipstick    Plan:  Continued routine obstetrical care,   Return in about 4 weeks (around 12/26/2015) for LROB.

## 2015-12-26 ENCOUNTER — Encounter: Payer: Medicaid Other | Admitting: Advanced Practice Midwife

## 2015-12-27 ENCOUNTER — Encounter: Payer: Medicaid Other | Admitting: Advanced Practice Midwife

## 2016-01-02 ENCOUNTER — Ambulatory Visit (INDEPENDENT_AMBULATORY_CARE_PROVIDER_SITE_OTHER): Payer: Medicaid Other | Admitting: Obstetrics and Gynecology

## 2016-01-02 ENCOUNTER — Encounter: Payer: Self-pay | Admitting: Obstetrics and Gynecology

## 2016-01-02 VITALS — BP 108/64 | HR 68 | Wt 162.0 lb

## 2016-01-02 DIAGNOSIS — Z1389 Encounter for screening for other disorder: Secondary | ICD-10-CM

## 2016-01-02 DIAGNOSIS — Z3492 Encounter for supervision of normal pregnancy, unspecified, second trimester: Secondary | ICD-10-CM

## 2016-01-02 DIAGNOSIS — Z331 Pregnant state, incidental: Secondary | ICD-10-CM

## 2016-01-02 LAB — POCT URINALYSIS DIPSTICK
Blood, UA: NEGATIVE
GLUCOSE UA: NEGATIVE
KETONES UA: NEGATIVE
LEUKOCYTES UA: NEGATIVE
Nitrite, UA: NEGATIVE
PROTEIN UA: NEGATIVE

## 2016-01-02 NOTE — Progress Notes (Signed)
Patient ID: Cristina [name], female   DOB: 06/05/1986, 30 y.o.   MRN: [id number]  [id number] 611w5d Estimated Date of Delivery: 06/06/16  Blood pressure 108/64, pulse 68, weight 162 lb (73.483 kg), last menstrual period 08/31/2015.   refer to the ob flow sheet for FH and FHR, also BP, Wt, Urine results:notable for negative  Patient reports  + good fetal movement, denies any bleeding and no rupture of membranes symptoms or regular contractions. Patient complaints: None.   Of interest, pt states she had been reminiscing on the delivery of her son, delivered by me 9 years ago. She states her husband had noted that he thought his son appeared to be "handled too roughly" during delivery.   FHR: 148 bpm FH: 15 cm  Questions were answered. Assessment: LROB [id number] @ 1911w5d   Plan:  Continued routine obstetrical care,   F/u in 2 weeks for anatomy u/s  By signing my name below, I, Ronney LionSuzanne Le, attest that this documentation has been prepared under the direction and in the presence of Tilda BurrowJohn V Jovoni Borkenhagen, MD. Electronically Signed: Ronney LionSuzanne Le, ED Scribe. 01/02/2016. 1:58 PM.  I personally performed the services described in this documentation, which was SCRIBED in my presence. The recorded information has been reviewed and considered accurate. It has been edited as necessary during review. Tilda BurrowFERGUSON,Daphna Lafuente V, MD

## 2016-01-02 NOTE — Progress Notes (Signed)
Pt denies any problems or concerns at this time.  

## 2016-01-14 ENCOUNTER — Other Ambulatory Visit: Payer: Self-pay | Admitting: Obstetrics and Gynecology

## 2016-01-14 DIAGNOSIS — Z1389 Encounter for screening for other disorder: Secondary | ICD-10-CM

## 2016-01-23 ENCOUNTER — Ambulatory Visit (INDEPENDENT_AMBULATORY_CARE_PROVIDER_SITE_OTHER): Payer: Medicaid Other | Admitting: Advanced Practice Midwife

## 2016-01-23 ENCOUNTER — Encounter: Payer: Self-pay | Admitting: Advanced Practice Midwife

## 2016-01-23 ENCOUNTER — Ambulatory Visit (INDEPENDENT_AMBULATORY_CARE_PROVIDER_SITE_OTHER): Payer: Medicaid Other

## 2016-01-23 VITALS — BP 110/80 | HR 72 | Wt 164.0 lb

## 2016-01-23 DIAGNOSIS — Z3492 Encounter for supervision of normal pregnancy, unspecified, second trimester: Secondary | ICD-10-CM

## 2016-01-23 DIAGNOSIS — Z36 Encounter for antenatal screening of mother: Secondary | ICD-10-CM | POA: Diagnosis not present

## 2016-01-23 DIAGNOSIS — Z331 Pregnant state, incidental: Secondary | ICD-10-CM

## 2016-01-23 DIAGNOSIS — Z1389 Encounter for screening for other disorder: Secondary | ICD-10-CM

## 2016-01-23 LAB — POCT URINALYSIS DIPSTICK
Blood, UA: NEGATIVE
Glucose, UA: NEGATIVE
Ketones, UA: NEGATIVE
Leukocytes, UA: NEGATIVE
NITRITE UA: NEGATIVE
PROTEIN UA: NEGATIVE

## 2016-01-23 NOTE — Progress Notes (Signed)
US 20+5 wks,cephalic,fhr 154 bpm,cx 4.4 cm,post pl gr 0,normal ov's bilat,svp of fluid 5.9 cm,LVEICF 1.8 mm,efw 412 g,measurements c/w dates,anatomy complete no obvious abnormalities seen

## 2016-01-23 NOTE — Progress Notes (Signed)
[id number] Estimated Date of Delivery: 06/06/16  Last menstrual period 08/31/2015.   BP weight and urine results all reviewed and noted.  Please refer to the obstetrical flow sheet for the fundal height and fetal heart rate documentation:US 20+5 wks,cephalic,fhr 154 bpm,cx 4.4 cm,post pl gr 0,normal ov's bilat,svp of fluid 5.9 cm,LVEICF 1.8 mm,efw 412 g,measurements c/w dates,anatomy complete no obvious abnormalities seen  Pt declined NT/IT. Discussed EICF. Will think about AFP, CfDNA Patient reports good fetal movement, denies any bleeding and no rupture of membranes symptoms or regular contractions. Patient is without complaints. All questions were answered.  Orders Placed This Encounter  Procedures  . POCT urinalysis dipstick    Plan:  Continued routine obstetrical care,   Return in about 4 weeks (around 02/20/2016) for LROB.

## 2016-02-20 ENCOUNTER — Encounter: Payer: Self-pay | Admitting: Women's Health

## 2016-02-20 ENCOUNTER — Ambulatory Visit (INDEPENDENT_AMBULATORY_CARE_PROVIDER_SITE_OTHER): Payer: Medicaid Other | Admitting: Women's Health

## 2016-02-20 VITALS — BP 96/58 | HR 68 | Wt 168.5 lb

## 2016-02-20 DIAGNOSIS — Z3492 Encounter for supervision of normal pregnancy, unspecified, second trimester: Secondary | ICD-10-CM

## 2016-02-20 DIAGNOSIS — Z331 Pregnant state, incidental: Secondary | ICD-10-CM

## 2016-02-20 DIAGNOSIS — Z1389 Encounter for screening for other disorder: Secondary | ICD-10-CM

## 2016-02-20 DIAGNOSIS — O283 Abnormal ultrasonic finding on antenatal screening of mother: Secondary | ICD-10-CM

## 2016-02-20 DIAGNOSIS — Z3A25 25 weeks gestation of pregnancy: Secondary | ICD-10-CM

## 2016-02-20 LAB — POCT URINALYSIS DIPSTICK
GLUCOSE UA: NEGATIVE
KETONES UA: NEGATIVE
Leukocytes, UA: NEGATIVE
NITRITE UA: NEGATIVE
Protein, UA: NEGATIVE
RBC UA: NEGATIVE

## 2016-02-20 NOTE — Addendum Note (Signed)
Addended by: Shawna ClampBOOKER, Tykee Heideman R on: 02/20/2016 10:35 AM   Modules accepted: Orders

## 2016-02-20 NOTE — Progress Notes (Signed)
Low-risk OB appointment [id number] Estimated Date of Delivery: 06/06/16 BP 96/58 mmHg  Pulse 68  Wt 168 lb 8 oz (76.431 kg)  LMP 08/31/2015 (Approximate)  BP, weight, and urine reviewed.  Refer to obstetrical flow sheet for FH & FHR.  Reports good fm.  Denies regular uc's, lof, vb, or uti s/s. No complaints. EICF on anatomy scan, decided she does not want genetic screening- will repeat u/s @ 28wks.   Reviewed ptl s/s, fm. Plan:  Continue routine obstetrical care  F/U in 4wks for OB appointment, pn2, and f/u u/s EICF

## 2016-02-20 NOTE — Patient Instructions (Signed)
You will have your sugar test next visit.  Please do not eat or drink anything after midnight the night before you come, not even water.  You will be here for at least two hours.     Call the office ([phone]) or go to [facility] if:  You begin to have strong, frequent contractions  Your water breaks.  Sometimes it is a big gush of fluid, sometimes it is just a trickle that keeps getting your panties wet or running down your legs  You have vaginal bleeding.  It is normal to have a small amount of spotting if your cervix was checked.   You don't feel your baby moving like normal.  If you don't, get you something to eat and drink and lay down and focus on feeling your baby move.   If your baby is still not moving like normal, you should call the office or go to [facility].  Second Trimester of Pregnancy The second trimester is from week 13 through week 28, months 4 through 6. The second trimester is often a time when you feel your best. Your body has also adjusted to being pregnant, and you begin to feel better physically. Usually, morning sickness has lessened or quit completely, you may have more energy, and you may have an increase in appetite. The second trimester is also a time when the fetus is growing rapidly. At the end of the sixth month, the fetus is about 9 inches long and weighs about 1 pounds. You will likely begin to feel the baby move (quickening) between 18 and 20 weeks of the pregnancy. BODY CHANGES Your body goes through many changes during pregnancy. The changes vary from woman to woman.   Your weight will continue to increase. You will notice your lower abdomen bulging out.  You may begin to get stretch marks on your hips, abdomen, and breasts.  You may develop headaches that can be relieved by medicines approved by your health care provider.  You may urinate more often because the fetus is pressing on your bladder.  You may develop or continue to have  heartburn as a result of your pregnancy.  You may develop constipation because certain hormones are causing the muscles that push waste through your intestines to slow down.  You may develop hemorrhoids or swollen, bulging veins (varicose veins).  You may have back pain because of the weight gain and pregnancy hormones relaxing your joints between the bones in your pelvis and as a result of a shift in weight and the muscles that support your balance.  Your breasts will continue to grow and be tender.  Your gums may bleed and may be sensitive to brushing and flossing.  Dark spots or blotches (chloasma, mask of pregnancy) may develop on your face. This will likely fade after the baby is born.  A dark line from your belly button to the pubic area (linea nigra) may appear. This will likely fade after the baby is born.  You may have changes in your hair. These can include thickening of your hair, rapid growth, and changes in texture. Some women also have hair loss during or after pregnancy, or hair that feels dry or thin. Your hair will most likely return to normal after your baby is born. WHAT TO EXPECT AT YOUR PRENATAL VISITS During a routine prenatal visit:  You will be weighed to make sure you and the fetus are growing normally.  Your blood pressure will be taken.    Your abdomen will be measured to track your baby's growth.  The fetal heartbeat will be listened to.  Any test results from the previous visit will be discussed. Your health care provider may ask you:  How you are feeling.  If you are feeling the baby move.  If you have had any abnormal symptoms, such as leaking fluid, bleeding, severe headaches, or abdominal cramping.  If you have any questions. Other tests that may be performed during your second trimester include:  Blood tests that check for:  Low iron levels (anemia).  Gestational diabetes (between 24 and 28 weeks).  Rh antibodies.  Urine tests to check  for infections, diabetes, or protein in the urine.  An ultrasound to confirm the proper growth and development of the baby.  An amniocentesis to check for possible genetic problems.  Fetal screens for spina bifida and Down syndrome. HOME CARE INSTRUCTIONS   Avoid all smoking, herbs, alcohol, and unprescribed drugs. These chemicals affect the formation and growth of the baby.  Follow your health care provider's instructions regarding medicine use. There are medicines that are either safe or unsafe to take during pregnancy.  Exercise only as directed by your health care provider. Experiencing uterine cramps is a good sign to stop exercising.  Continue to eat regular, healthy meals.  Wear a good support bra for breast tenderness.  Do not use hot tubs, steam rooms, or saunas.  Wear your seat belt at all times when driving.  Avoid raw meat, uncooked cheese, cat litter boxes, and soil used by cats. These carry germs that can cause birth defects in the baby.  Take your prenatal vitamins.  Try taking a stool softener (if your health care provider approves) if you develop constipation. Eat more high-fiber foods, such as fresh vegetables or fruit and whole grains. Drink plenty of fluids to keep your urine clear or pale yellow.  Take warm sitz baths to soothe any pain or discomfort caused by hemorrhoids. Use hemorrhoid cream if your health care provider approves.  If you develop varicose veins, wear support hose. Elevate your feet for 15 minutes, 3-4 times a day. Limit salt in your diet.  Avoid heavy lifting, wear low heel shoes, and practice good posture.  Rest with your legs elevated if you have leg cramps or low back pain.  Visit your dentist if you have not gone yet during your pregnancy. Use a soft toothbrush to brush your teeth and be gentle when you floss.  A sexual relationship may be continued unless your health care provider directs you otherwise.  Continue to go to all your  prenatal visits as directed by your health care provider. SEEK MEDICAL CARE IF:   You have dizziness.  You have mild pelvic cramps, pelvic pressure, or nagging pain in the abdominal area.  You have persistent nausea, vomiting, or diarrhea.  You have a bad smelling vaginal discharge.  You have pain with urination. SEEK IMMEDIATE MEDICAL CARE IF:   You have a fever.  You are leaking fluid from your vagina.  You have spotting or bleeding from your vagina.  You have severe abdominal cramping or pain.  You have rapid weight gain or loss.  You have shortness of breath with chest pain.  You notice sudden or extreme swelling of your face, hands, ankles, feet, or legs.  You have not felt your baby move in over an hour.  You have severe headaches that do not go away with medicine.  You have vision changes.   Document Released: 09/02/2001 Document Revised: 09/13/2013 Document Reviewed: 11/09/2012 Exit[employer] Patient Information 2015 [employer]. This information is not intended to replace advice given to you by your health care provider. Make sure you discuss any questions you have with your health care provider.     

## 2016-03-17 ENCOUNTER — Encounter: Payer: Self-pay | Admitting: Women's Health

## 2016-03-17 ENCOUNTER — Ambulatory Visit (INDEPENDENT_AMBULATORY_CARE_PROVIDER_SITE_OTHER): Payer: Medicaid Other | Admitting: Women's Health

## 2016-03-17 ENCOUNTER — Ambulatory Visit (INDEPENDENT_AMBULATORY_CARE_PROVIDER_SITE_OTHER): Payer: Medicaid Other

## 2016-03-17 ENCOUNTER — Other Ambulatory Visit: Payer: Medicaid Other

## 2016-03-17 VITALS — BP 108/66 | HR 64 | Wt 173.0 lb

## 2016-03-17 DIAGNOSIS — Z3483 Encounter for supervision of other normal pregnancy, third trimester: Secondary | ICD-10-CM

## 2016-03-17 DIAGNOSIS — O283 Abnormal ultrasonic finding on antenatal screening of mother: Secondary | ICD-10-CM | POA: Insufficient documentation

## 2016-03-17 DIAGNOSIS — Z3A29 29 weeks gestation of pregnancy: Secondary | ICD-10-CM | POA: Diagnosis not present

## 2016-03-17 DIAGNOSIS — Z3493 Encounter for supervision of normal pregnancy, unspecified, third trimester: Secondary | ICD-10-CM

## 2016-03-17 DIAGNOSIS — Z3492 Encounter for supervision of normal pregnancy, unspecified, second trimester: Secondary | ICD-10-CM

## 2016-03-17 DIAGNOSIS — Z369 Encounter for antenatal screening, unspecified: Secondary | ICD-10-CM

## 2016-03-17 DIAGNOSIS — Z331 Pregnant state, incidental: Secondary | ICD-10-CM

## 2016-03-17 DIAGNOSIS — Z1389 Encounter for screening for other disorder: Secondary | ICD-10-CM

## 2016-03-17 DIAGNOSIS — Z131 Encounter for screening for diabetes mellitus: Secondary | ICD-10-CM

## 2016-03-17 LAB — POCT URINALYSIS DIPSTICK
Blood, UA: NEGATIVE
Glucose, UA: NEGATIVE
Ketones, UA: NEGATIVE
LEUKOCYTES UA: NEGATIVE
NITRITE UA: NEGATIVE
Protein, UA: NEGATIVE

## 2016-03-17 NOTE — Patient Instructions (Signed)

## 2016-03-17 NOTE — Progress Notes (Signed)
Low-risk OB appointment [id number] 3112w3d Estimated Date of Delivery: 06/06/16 BP 108/66 mmHg  Pulse 64  Wt 173 lb (78.472 kg)  LMP 08/31/2015 (Approximate)  BP, weight, and urine reviewed.  Refer to obstetrical flow sheet for FH & FHR.  Reports good fm.  Denies regular uc's, lof, vb, or uti s/s. No complaints. Reviewed today's u/s for f/u isolated EICF w/o genetic screening- EICF stable. Discussed ptl s/s, fkc, Recommended Tdap at HD/PCP per CDC guidelines.  Plan:  Continue routine obstetrical care  F/U in 4wks for OB appointment  PN2 today

## 2016-03-17 NOTE — Progress Notes (Signed)
US 28+3 wks,cephalic,cx 3.2 cm,normal ov's bilat,post pl g 1,fhr 151 bpm,LVEICF N/C, AFI 11cm,EFW 1297 g 58%

## 2016-03-18 LAB — CBC
Hematocrit: 39.4 % (ref 34.0–46.6)
Hemoglobin: 13 g/dL (ref 11.1–15.9)
MCH: 30.8 pg (ref 26.6–33.0)
MCHC: 33 g/dL (ref 31.5–35.7)
MCV: 93 fL (ref 79–97)
PLATELETS: 188 10*3/uL (ref 150–379)
RBC: 4.22 x10E6/uL (ref 3.77–5.28)
RDW: 13.7 % (ref 12.3–15.4)
WBC: 7.5 10*3/uL (ref 3.4–10.8)

## 2016-03-18 LAB — ANTIBODY SCREEN: Antibody Screen: NEGATIVE

## 2016-03-18 LAB — GLUCOSE TOLERANCE, 2 HOURS W/ 1HR
GLUCOSE, 1 HOUR: 119 mg/dL (ref 65–179)
Glucose, 2 hour: 104 mg/dL (ref 65–152)
Glucose, Fasting: 75 mg/dL (ref 65–91)

## 2016-03-18 LAB — HIV ANTIBODY (ROUTINE TESTING W REFLEX): HIV Screen 4th Generation wRfx: NONREACTIVE

## 2016-03-18 LAB — RPR: RPR Ser Ql: NONREACTIVE

## 2016-04-14 ENCOUNTER — Encounter: Payer: Medicaid Other | Admitting: Women's Health

## 2016-04-15 ENCOUNTER — Encounter: Payer: Self-pay | Admitting: Women's Health

## 2016-04-15 ENCOUNTER — Ambulatory Visit (INDEPENDENT_AMBULATORY_CARE_PROVIDER_SITE_OTHER): Payer: Medicaid Other | Admitting: Women's Health

## 2016-04-15 VITALS — BP 104/60 | HR 72 | Wt 181.0 lb

## 2016-04-15 DIAGNOSIS — Z3A33 33 weeks gestation of pregnancy: Secondary | ICD-10-CM

## 2016-04-15 DIAGNOSIS — Z3483 Encounter for supervision of other normal pregnancy, third trimester: Secondary | ICD-10-CM

## 2016-04-15 DIAGNOSIS — Z3493 Encounter for supervision of normal pregnancy, unspecified, third trimester: Secondary | ICD-10-CM

## 2016-04-15 DIAGNOSIS — Z1389 Encounter for screening for other disorder: Secondary | ICD-10-CM

## 2016-04-15 DIAGNOSIS — Z331 Pregnant state, incidental: Secondary | ICD-10-CM

## 2016-04-15 LAB — POCT URINALYSIS DIPSTICK
Blood, UA: NEGATIVE
Glucose, UA: NEGATIVE
KETONES UA: NEGATIVE
Leukocytes, UA: NEGATIVE
Nitrite, UA: NEGATIVE
Protein, UA: NEGATIVE

## 2016-04-15 NOTE — Progress Notes (Signed)
Low-risk OB appointment [id number] [redacted]w[redacted]d Estimated Date of Delivery: 06/06/16 BP 104/60   Pulse 72   Wt 181 lb (82.1 kg)   LMP 08/31/2015 (Approximate)   BMI 32.06 kg/m   BP, weight, and urine reviewed.  Refer to obstetrical flow sheet for FH & FHR.  Reports good fm.  Denies regular uc's, lof, vb, or uti s/s. No complaints. Got tdap today at [facility] Reviewed ptl s/s, fkc. Plan:  Continue routine obstetrical care  F/U in 2wks for OB appointment

## 2016-04-15 NOTE — Patient Instructions (Signed)
Call the office ([phone]) or go to [facility] if:  You begin to have strong, frequent contractions  Your water breaks.  Sometimes it is a big gush of fluid, sometimes it is just a trickle that keeps getting your panties wet or running down your legs  You have vaginal bleeding.  It is normal to have a small amount of spotting if your cervix was checked.   You don't feel your baby moving like normal.  If you don't, get you something to eat and drink and lay down and focus on feeling your baby move.  You should feel at least 10 movements in 2 hours.  If you don't, you should call the office or go to [facility].    Preterm Labor Information Preterm labor is when labor starts at less than 37 weeks of pregnancy. The normal length of a pregnancy is 39 to 41 weeks. CAUSES Often, there is no identifiable underlying cause as to why a woman goes into preterm labor. One of the most common known causes of preterm labor is infection. Infections of the uterus, cervix, vagina, amniotic sac, bladder, kidney, or even the lungs (pneumonia) can cause labor to start. Other suspected causes of preterm labor include:   Urogenital infections, such as yeast infections and bacterial vaginosis.   Uterine abnormalities (uterine shape, uterine septum, fibroids, or bleeding from the placenta).   A cervix that has been operated on (it may fail to stay closed).   Malformations in the fetus.   Multiple gestations (twins, triplets, and so on).   Breakage of the amniotic sac.  RISK FACTORS  Having a previous history of preterm labor.   Having premature rupture of membranes (PROM).   Having a placenta that covers the opening of the cervix (placenta previa).   Having a placenta that separates from the uterus (placental abruption).   Having a cervix that is too weak to hold the fetus in the uterus (incompetent cervix).   Having too much fluid in the amniotic sac (polyhydramnios).   Taking  illegal drugs or smoking while pregnant.   Not gaining enough weight while pregnant.   Being younger than 18 and older than 31 years old.   Having a low socioeconomic status.   Being African American. SYMPTOMS Signs and symptoms of preterm labor include:   Menstrual-like cramps, abdominal pain, or back pain.  Uterine contractions that are regular, as frequent as six in an hour, regardless of their intensity (may be mild or painful).  Contractions that start on the top of the uterus and spread down to the lower abdomen and back.   A sense of increased pelvic pressure.   A watery or bloody mucus discharge that comes from the vagina.  TREATMENT Depending on the length of the pregnancy and other circumstances, your health care provider may suggest bed rest. If necessary, there are medicines that can be given to stop contractions and to mature the fetal lungs. If labor happens before 34 weeks of pregnancy, a prolonged hospital stay may be recommended. Treatment depends on the condition of both you and the fetus.  WHAT SHOULD YOU DO IF YOU THINK YOU ARE IN PRETERM LABOR? Call your health care provider right away. You will need to go to the hospital to get checked immediately. HOW CAN YOU PREVENT PRETERM LABOR IN FUTURE PREGNANCIES? You should:   Stop smoking if you smoke.  Maintain healthy weight gain and avoid chemicals and drugs that are not necessary.  Be watchful for   any type of infection.  Inform your health care provider if you have a known history of preterm labor.   This information is not intended to replace advice given to you by your health care provider. Make sure you discuss any questions you have with your health care provider.   Document Released: 11/29/2003 Document Revised: 05/11/2013 Document Reviewed: 10/11/2012 Elsevier Interactive Patient Education 2016 Elsevier Inc.  

## 2016-04-18 ENCOUNTER — Telehealth: Payer: Self-pay | Admitting: *Deleted

## 2016-04-18 ENCOUNTER — Inpatient Hospital Stay (HOSPITAL_COMMUNITY)
Admission: AD | Admit: 2016-04-18 | Discharge: 2016-04-18 | Disposition: A | Discharge status: Home / Self Care | Payer: Medicaid Other | Source: Ambulatory Visit | Attending: Obstetrics & Gynecology | Admitting: Obstetrics & Gynecology

## 2016-04-18 ENCOUNTER — Encounter (HOSPITAL_COMMUNITY): Payer: Self-pay | Admitting: *Deleted

## 2016-04-18 DIAGNOSIS — Z3A33 33 weeks gestation of pregnancy: Secondary | ICD-10-CM | POA: Diagnosis present

## 2016-04-18 DIAGNOSIS — Z87891 Personal history of nicotine dependence: Secondary | ICD-10-CM | POA: Diagnosis not present

## 2016-04-18 DIAGNOSIS — Y92410 Unspecified street and highway as the place of occurrence of the external cause: Secondary | ICD-10-CM | POA: Diagnosis not present

## 2016-04-18 DIAGNOSIS — S3991XA Unspecified injury of abdomen, initial encounter: Secondary | ICD-10-CM

## 2016-04-18 DIAGNOSIS — O0913 Supervision of pregnancy with history of ectopic or molar pregnancy, third trimester: Secondary | ICD-10-CM | POA: Diagnosis not present

## 2016-04-18 DIAGNOSIS — Z3493 Encounter for supervision of normal pregnancy, unspecified, third trimester: Secondary | ICD-10-CM

## 2016-04-18 DIAGNOSIS — O9A213 Injury, poisoning and certain other consequences of external causes complicating pregnancy, third trimester: Secondary | ICD-10-CM | POA: Diagnosis present

## 2016-04-18 HISTORY — DX: Unspecified ectopic pregnancy without intrauterine pregnancy: O00.90

## 2016-04-18 NOTE — MAU Note (Signed)
Called Philipp Deputy CNM of variable to 90s, CNM aware was in room when it occurred, okay to discharge home.
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
## 2016-04-18 NOTE — MAU Note (Signed)
Urine sent to lab 

## 2016-04-18 NOTE — Discharge Instructions (Signed)
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
## 2016-04-23 ENCOUNTER — Telehealth: Payer: Self-pay | Admitting: Women's Health

## 2016-04-23 NOTE — Telephone Encounter (Signed)
Pt 7 mos pregnant, pt states she is moving into a different apartment complex and is concerned about her new neighbors who have 2 cats but live in a separate apartment but in the same building.   Pt informed she does not need to handle cat bowel waste or clean cat litter boxes should not be any dangers since the cats are not hers, leave in a separate apartment and she will not be handling the cat litter/waste.

## 2016-04-23 NOTE — Telephone Encounter (Signed)
Needs to have a nurse call her back nothing urgent just has a question

## 2016-04-30 ENCOUNTER — Ambulatory Visit (INDEPENDENT_AMBULATORY_CARE_PROVIDER_SITE_OTHER): Payer: Medicaid Other | Admitting: Advanced Practice Midwife

## 2016-04-30 ENCOUNTER — Encounter: Payer: Self-pay | Admitting: Advanced Practice Midwife

## 2016-04-30 VITALS — BP 118/64 | HR 68 | Wt 189.0 lb

## 2016-04-30 DIAGNOSIS — Z1389 Encounter for screening for other disorder: Secondary | ICD-10-CM

## 2016-04-30 DIAGNOSIS — Z331 Pregnant state, incidental: Secondary | ICD-10-CM

## 2016-04-30 DIAGNOSIS — Z3493 Encounter for supervision of normal pregnancy, unspecified, third trimester: Secondary | ICD-10-CM

## 2016-04-30 LAB — POCT URINALYSIS DIPSTICK
Blood, UA: NEGATIVE
Glucose, UA: NEGATIVE
Ketones, UA: NEGATIVE
LEUKOCYTES UA: NEGATIVE
Nitrite, UA: NEGATIVE
PROTEIN UA: NEGATIVE

## 2016-04-30 NOTE — Progress Notes (Signed)
[id number] 4166w5d Estimated Date of Delivery: 06/06/16  Blood pressure 118/64, pulse 68, weight 189 lb (85.7 kg), last menstrual period 08/31/2015.   BP weight and urine results all reviewed and noted.  Please refer to the obstetrical flow sheet for the fundal height and fetal heart rate documentation:  Patient reports good fetal movement, denies any bleeding and no rupture of membranes symptoms or regular contractions. Patient is without complaints. All questions were answered.  Orders Placed This Encounter  Procedures  . POCT urinalysis dipstick    Plan:  Continued routine obstetrical care,   Return in about 2 weeks (around 05/14/2016) for LROB.

## 2016-04-30 NOTE — Patient Instructions (Signed)

## 2016-05-14 ENCOUNTER — Encounter: Payer: Medicaid Other | Admitting: Advanced Practice Midwife

## 2016-05-16 ENCOUNTER — Ambulatory Visit (INDEPENDENT_AMBULATORY_CARE_PROVIDER_SITE_OTHER): Payer: Medicaid Other | Admitting: Obstetrics & Gynecology

## 2016-05-16 ENCOUNTER — Encounter: Payer: Self-pay | Admitting: Obstetrics & Gynecology

## 2016-05-16 VITALS — Wt 189.0 lb

## 2016-05-16 DIAGNOSIS — Z3493 Encounter for supervision of normal pregnancy, unspecified, third trimester: Secondary | ICD-10-CM

## 2016-05-16 DIAGNOSIS — Z331 Pregnant state, incidental: Secondary | ICD-10-CM

## 2016-05-16 DIAGNOSIS — Z1159 Encounter for screening for other viral diseases: Secondary | ICD-10-CM

## 2016-05-16 DIAGNOSIS — Z118 Encounter for screening for other infectious and parasitic diseases: Secondary | ICD-10-CM

## 2016-05-16 DIAGNOSIS — Z1389 Encounter for screening for other disorder: Secondary | ICD-10-CM

## 2016-05-16 DIAGNOSIS — Z3685 Encounter for antenatal screening for Streptococcus B: Secondary | ICD-10-CM

## 2016-05-16 LAB — POCT URINALYSIS DIPSTICK
Blood, UA: NEGATIVE
GLUCOSE UA: NEGATIVE
Leukocytes, UA: NEGATIVE
Nitrite, UA: NEGATIVE
Protein, UA: NEGATIVE

## 2016-05-16 NOTE — Progress Notes (Signed)
[id number] 10376w0d Estimated Date of Delivery: 06/06/16  Weight 189 lb (85.7 kg), last menstrual period 08/31/2015.   BP weight and urine results all reviewed and noted.  Please refer to the obstetrical flow sheet for the fundal height and fetal heart rate documentation:  Patient reports good fetal movement, denies any bleeding and no rupture of membranes symptoms or regular contractions. Patient is without complaints. All questions were answered.  Orders Placed This Encounter  Procedures  . GC/Chlamydia Probe Amp  . Strep Gp B NAA  . POCT urinalysis dipstick    Plan:  Continued routine obstetrical care, GBS done  Return in about 1 week (around 05/23/2016) for LROB.

## 2016-05-18 LAB — STREP GP B NAA: Strep Gp B NAA: NEGATIVE

## 2016-05-19 LAB — GC/CHLAMYDIA PROBE AMP
Chlamydia trachomatis, NAA: NEGATIVE
Neisseria gonorrhoeae by PCR: NEGATIVE

## 2016-05-22 ENCOUNTER — Ambulatory Visit (INDEPENDENT_AMBULATORY_CARE_PROVIDER_SITE_OTHER): Payer: Medicaid Other | Admitting: Obstetrics and Gynecology

## 2016-05-22 VITALS — BP 120/84 | HR 73 | Wt 193.2 lb

## 2016-05-22 DIAGNOSIS — Z3A38 38 weeks gestation of pregnancy: Secondary | ICD-10-CM

## 2016-05-22 DIAGNOSIS — Z1389 Encounter for screening for other disorder: Secondary | ICD-10-CM

## 2016-05-22 DIAGNOSIS — Z3493 Encounter for supervision of normal pregnancy, unspecified, third trimester: Secondary | ICD-10-CM

## 2016-05-22 DIAGNOSIS — Z331 Pregnant state, incidental: Secondary | ICD-10-CM

## 2016-05-22 DIAGNOSIS — Z3483 Encounter for supervision of other normal pregnancy, third trimester: Secondary | ICD-10-CM

## 2016-05-22 LAB — POCT URINALYSIS DIPSTICK
Blood, UA: NEGATIVE
GLUCOSE UA: NEGATIVE
KETONES UA: NEGATIVE
LEUKOCYTES UA: NEGATIVE
Nitrite, UA: NEGATIVE
PROTEIN UA: NEGATIVE

## 2016-05-22 NOTE — Progress Notes (Signed)
Erlene Sentersdrienne D Cobb is a 30 y.o. female  [phone]  Estimated Date of Delivery: 06/06/16 LROB 4414w6d  Blood pressure 120/84, pulse 73, weight 193 lb 3.2 oz (87.6 kg), last menstrual period 08/31/2015.   Urine results: Negative  Chief Complaint  Patient presents with  . Routine Prenatal Visit    Patient has no acute complaints at this time. Patient reports good fetal movement;   denies any bleeding , rupture of membranes,or regular contractions.  refer to the ob flow sheet for FH and FHR, ,                          Physical Examination: General appearance - alert, well appearing, and in no distress and oriented to person, place, and time                                      Abdomen - FH 35cm ,                                                         -FHR 140                                                                        Pelvic - examination not indicated                                            Questions were answered. Assessment: LROB [id number] @ 2714w6d   Plan:  Continued routine obstetrical care  F/u in 1 week for routine prenatal care   By signing my name below, I, Freida Busmaniana Omoyeni, attest that this documentation has been prepared under the direction and in the presence of Tilda BurrowJohn V Shoua Ressler, MD . Electronically Signed: Freida Busmaniana Omoyeni, Scribe. 05/22/2016. 9:42 AM. I personally performed the services described in this documentation, which was SCRIBED in my presence. The recorded information has been reviewed and considered accurate. It has been edited as necessary during review. Tilda BurrowFERGUSON,Tola Meas V, MD

## 2016-05-29 ENCOUNTER — Ambulatory Visit (INDEPENDENT_AMBULATORY_CARE_PROVIDER_SITE_OTHER): Payer: Medicaid Other | Admitting: Obstetrics and Gynecology

## 2016-05-29 VITALS — BP 130/82 | HR 83 | Wt 197.0 lb

## 2016-05-29 DIAGNOSIS — Z3490 Encounter for supervision of normal pregnancy, unspecified, unspecified trimester: Secondary | ICD-10-CM

## 2016-05-29 DIAGNOSIS — Z3A39 39 weeks gestation of pregnancy: Secondary | ICD-10-CM

## 2016-05-29 DIAGNOSIS — Z3483 Encounter for supervision of other normal pregnancy, third trimester: Secondary | ICD-10-CM

## 2016-05-29 DIAGNOSIS — Z1389 Encounter for screening for other disorder: Secondary | ICD-10-CM

## 2016-05-29 DIAGNOSIS — Z331 Pregnant state, incidental: Secondary | ICD-10-CM

## 2016-05-29 DIAGNOSIS — Z3493 Encounter for supervision of normal pregnancy, unspecified, third trimester: Secondary | ICD-10-CM

## 2016-05-29 LAB — POCT URINALYSIS DIPSTICK
Blood, UA: NEGATIVE
Glucose, UA: NEGATIVE
Ketones, UA: NEGATIVE
LEUKOCYTES UA: NEGATIVE
NITRITE UA: NEGATIVE
Protein, UA: NEGATIVE

## 2016-05-29 NOTE — Progress Notes (Signed)
Cristina [name] is a 30 y.o. female  [phone]  Estimated Date of Delivery: 06/06/16 LROB 5245w6d  Last menstrual period 08/31/2015.   Urine results:NEGATIVE   Chief Complaint  Patient presents with  . Routine Prenatal Visit    Patient has no acute complaints at this time. Patient reports  good fetal movement,                           denies any bleeding , rupture of membranes,or regular contractions.  refer to the ob flow sheet for FH and FHR,                        Physical Examination: General appearance - alert, well appearing, and in no distress and oriented to person, place, and time                                      Abdomen - FH 36 cm,                                                         -FHR 131                                                         soft, nontender, nondistended, no masses or organomegaly                                      Pelvic - VULVA: normal appearing vulva with no masses, tenderness or lesions VAGINA: normal appearing vagina with normal color and discharge, no lesions CERVIX: tight fingertip; posterior long                                             Questions were answered. Assessment: LROB [id number] @ 9045w6d  Plan:  Continued routine obstetrical care  F/u in 1 week for routine obstetrical care  By signing my name below, I, Freida Busmaniana Omoyeni, attest that this documentation has been prepared under the direction and in the presence of Tilda BurrowJohn V Ishi Danser, MD . Electronically Signed: Freida Busmaniana Omoyeni, Scribe. 05/29/2016. 10:30 AM. I personally performed the services described in this documentation, which was SCRIBED in my presence. The recorded information has been reviewed and considered accurate. It has been edited as necessary during review. Tilda BurrowFERGUSON,Lillard Bailon V, MD

## 2016-06-05 ENCOUNTER — Encounter (HOSPITAL_COMMUNITY): Payer: Self-pay | Admitting: *Deleted

## 2016-06-05 ENCOUNTER — Ambulatory Visit (INDEPENDENT_AMBULATORY_CARE_PROVIDER_SITE_OTHER): Payer: Medicaid Other | Admitting: Advanced Practice Midwife

## 2016-06-05 ENCOUNTER — Encounter (INDEPENDENT_AMBULATORY_CARE_PROVIDER_SITE_OTHER): Payer: Self-pay

## 2016-06-05 ENCOUNTER — Inpatient Hospital Stay (HOSPITAL_COMMUNITY)
Admission: AD | Admit: 2016-06-05 | Discharge: 2016-06-09 | DRG: 775 | Disposition: A | Discharge status: Home / Self Care | Payer: Medicaid Other | Source: Ambulatory Visit | Attending: Obstetrics and Gynecology | Admitting: Obstetrics and Gynecology

## 2016-06-05 VITALS — BP 170/106 | HR 74 | Wt 201.0 lb

## 2016-06-05 DIAGNOSIS — O134 Gestational [pregnancy-induced] hypertension without significant proteinuria, complicating childbirth: Principal | ICD-10-CM | POA: Diagnosis present

## 2016-06-05 DIAGNOSIS — Z3A40 40 weeks gestation of pregnancy: Secondary | ICD-10-CM

## 2016-06-05 DIAGNOSIS — Z87891 Personal history of nicotine dependence: Secondary | ICD-10-CM

## 2016-06-05 DIAGNOSIS — O133 Gestational [pregnancy-induced] hypertension without significant proteinuria, third trimester: Secondary | ICD-10-CM | POA: Diagnosis present

## 2016-06-05 DIAGNOSIS — R030 Elevated blood-pressure reading, without diagnosis of hypertension: Secondary | ICD-10-CM

## 2016-06-05 DIAGNOSIS — Z1389 Encounter for screening for other disorder: Secondary | ICD-10-CM

## 2016-06-05 DIAGNOSIS — Z3493 Encounter for supervision of normal pregnancy, unspecified, third trimester: Secondary | ICD-10-CM

## 2016-06-05 DIAGNOSIS — Z3483 Encounter for supervision of other normal pregnancy, third trimester: Secondary | ICD-10-CM

## 2016-06-05 DIAGNOSIS — Z331 Pregnant state, incidental: Secondary | ICD-10-CM

## 2016-06-05 LAB — CBC
HEMATOCRIT: 37 % (ref 36.0–46.0)
Hemoglobin: 13.2 g/dL (ref 12.0–15.0)
MCH: 31.8 pg (ref 26.0–34.0)
MCHC: 35.7 g/dL (ref 30.0–36.0)
MCV: 89.2 fL (ref 78.0–100.0)
PLATELETS: 202 10*3/uL (ref 150–400)
RBC: 4.15 MIL/uL (ref 3.87–5.11)
RDW: 13.2 % (ref 11.5–15.5)
WBC: 9.3 10*3/uL (ref 4.0–10.5)

## 2016-06-05 LAB — COMPREHENSIVE METABOLIC PANEL
ALBUMIN: 3 g/dL — AB (ref 3.5–5.0)
ALT: 17 U/L (ref 14–54)
ANION GAP: 8 (ref 5–15)
AST: 26 U/L (ref 15–41)
Alkaline Phosphatase: 193 U/L — ABNORMAL HIGH (ref 38–126)
BILIRUBIN TOTAL: 0.8 mg/dL (ref 0.3–1.2)
BUN: 9 mg/dL (ref 6–20)
CHLORIDE: 104 mmol/L (ref 101–111)
CO2: 21 mmol/L — ABNORMAL LOW (ref 22–32)
Calcium: 9.1 mg/dL (ref 8.9–10.3)
Creatinine, Ser: 0.57 mg/dL (ref 0.44–1.00)
GFR calc Af Amer: >60 mL/min (ref >60)
GLUCOSE: 126 mg/dL — AB (ref 65–99)
POTASSIUM: 3.3 mmol/L — AB (ref 3.5–5.1)
Sodium: 133 mmol/L — ABNORMAL LOW (ref 135–145)
TOTAL PROTEIN: 6.5 g/dL (ref 6.5–8.1)

## 2016-06-05 LAB — PROTEIN / CREATININE RATIO, URINE
CREATININE, URINE: 100 mg/dL
PROTEIN CREATININE RATIO: 0.16 mg/mg{creat} — AB (ref 0.00–0.15)
Total Protein, Urine: 16 mg/dL

## 2016-06-05 LAB — POCT URINALYSIS DIPSTICK
Blood, UA: NEGATIVE
Glucose, UA: NEGATIVE
Ketones, UA: NEGATIVE
LEUKOCYTES UA: NEGATIVE
NITRITE UA: NEGATIVE

## 2016-06-05 MED ORDER — LACTATED RINGERS IV SOLN
INTRAVENOUS | Status: DC
  Administered 2016-06-05: 20:00:00 via INTRAVENOUS

## 2016-06-05 MED ORDER — ACETAMINOPHEN 325 MG PO TABS: 650.0000 mg | ORAL_TABLET | ORAL | Status: DC | PRN

## 2016-06-05 MED ORDER — ONDANSETRON HCL 4 MG/2ML IJ SOLN: 4.0000 mg | Freq: Four times a day (QID) | INTRAMUSCULAR | Status: DC | PRN

## 2016-06-05 MED ORDER — MISOPROSTOL 200 MCG PO TABS: 50.0000 ug | ORAL_TABLET | ORAL | Status: DC

## 2016-06-05 MED ORDER — OXYCODONE-ACETAMINOPHEN 5-325 MG PO TABS: 1.0000 | ORAL_TABLET | ORAL | Status: DC | PRN

## 2016-06-05 MED ORDER — OXYTOCIN BOLUS FROM INFUSION
500.0000 mL | Freq: Once | INTRAVENOUS | Status: AC
  Administered 2016-06-06: 500 mL via INTRAVENOUS

## 2016-06-05 MED ORDER — OXYTOCIN 40 UNITS IN LACTATED RINGERS INFUSION - SIMPLE MED
1.0000 m[IU]/min | INTRAVENOUS | Status: DC
  Filled 2016-06-05: qty 1000

## 2016-06-05 MED ORDER — OXYTOCIN 40 UNITS IN LACTATED RINGERS INFUSION - SIMPLE MED: 2.5000 [IU]/h | INTRAVENOUS | Status: DC

## 2016-06-05 MED ORDER — LACTATED RINGERS IV SOLN: 500.0000 mL | INTRAVENOUS | Status: DC | PRN

## 2016-06-05 MED ORDER — FENTANYL CITRATE (PF) 100 MCG/2ML IJ SOLN: 50.0000 ug | INTRAMUSCULAR | Status: DC | PRN

## 2016-06-05 MED ORDER — OXYCODONE-ACETAMINOPHEN 5-325 MG PO TABS: 2.0000 | ORAL_TABLET | ORAL | Status: DC | PRN

## 2016-06-05 MED ORDER — LIDOCAINE HCL (PF) 1 % IJ SOLN
30.0000 mL | INTRAMUSCULAR | Status: DC | PRN
  Filled 2016-06-05: qty 30

## 2016-06-05 MED ORDER — SOD CITRATE-CITRIC ACID 500-334 MG/5ML PO SOLN: 30.0000 mL | ORAL | Status: DC | PRN

## 2016-06-05 MED ORDER — FLEET ENEMA 7-19 GM/118ML RE ENEM: 1.0000 | ENEMA | RECTAL | Status: DC | PRN

## 2016-06-05 MED ORDER — TERBUTALINE SULFATE 1 MG/ML IJ SOLN
0.2500 mg | Freq: Once | INTRAMUSCULAR | Status: DC | PRN
  Filled 2016-06-05: qty 1

## 2016-06-05 NOTE — Addendum Note (Signed)
Addended by: Jacklyn ShellRESENZO-DISHMON, Suzan Manon on: 06/05/2016 01:52 PM   Modules accepted: Orders, SmartSet

## 2016-06-05 NOTE — Patient Instructions (Addendum)

## 2016-06-05 NOTE — H&P (Signed)
Cristina [name] is a 30 y.o. female [phone] with IUP at 8321w6d presenting for IOL for GHTN. [facility]  Prenatal History/Complications:  GHTN   Past Medical History: Past Medical History:  Diagnosis Date  . Ectopic pregnancy     Past Surgical History: Past Surgical History:  Procedure Laterality Date  . LAPAROSCOPIC UNILATERAL SALPINGECTOMY Right 01/01/2015   Procedure: LAPAROSCOPIC REMOVAL OF RIGHT FALLOPIAN TUBE AND ECTOPIC PREGNANCY ;  Surgeon: Lazaro ArmsLuther H Eure, MD;  Location: [facility];  Service: Gynecology;  Laterality: Right;    Obstetrical History: OB History    Gravida Para Term Preterm AB Living   5 1 1   3 1    SAB TAB Ectopic Multiple Live Births     1 1   1       Social History: Social History   Social History  . Marital status: Single    Spouse name: N/A  . Number of children: N/A  . Years of education: N/A   Social History Main Topics  . Smoking status: Former Smoker    Types: Cigarettes    Quit date: 12/13/2014  . Smokeless tobacco: Never Used  . Alcohol use No     Comment: beer before pregnancy  . Drug use: No  . Sexual activity: Yes   Other Topics Concern  . Not on file   Social History Narrative  . No narrative on file    Family History: Family History  Problem Relation Age of Onset  . Diabetes Mother   . Cancer Maternal Grandmother     pancreatic  . Cancer Maternal Aunt     breast  . Diabetes Maternal Aunt     2nd aunt    Allergies: No Known Allergies    Prenatal Transfer Tool  Maternal Diabetes: No Genetic Screening: Declined Maternal Ultrasounds/Referrals: Abnormal:  Findings:   Isolated EIF (echogenic intracardiac focus) Fetal Ultrasounds or other Referrals:  None Maternal Substance Abuse:  No Significant Maternal Medications:  None Significant Maternal Lab Results: Lab values include: Group B Strep negative   [facility]  Initiated Care at  7 weeks  FOB Sherald Hesshristopher Spreen  Dating By LMP/6  week US  Pap 2016 normal at HD  GC/CT Initial:      -/-          36+wks:-/-  Genetic Screen NT/IT: declined (offered again after EICF noted; still declined)  CF screen neg  Anatomic US Female, Isolated EICF, stable @ 28wks;   Flu vaccine   Tdap @ [facility]  Glucose Screen  2 hr normal: 75/119/104  GBS neg  Feed Preference breast  Contraception POPs  Circumcision n/a  Childbirth Classes declined  Pediatrician [facility]      Review of Systems   Constitutional: Negative for fever and chills Eyes: Negative for visual disturbances Respiratory: Negative for shortness of breath, dyspnea Cardiovascular: Negative for chest pain or palpitations  Gastrointestinal: Negative for abdominal pain, vomiting, diarrhea and constipation.   Genitourinary: Negative for dysuria and urgency Musculoskeletal: Negative for back pain, joint pain, myalgias  Neurological: Negative for dizziness and headaches      Blood pressure (!) 170/106, pulse 74, weight 91.2 kg (201 lb), last menstrual period 08/31/2015. General appearance: alert, cooperative and no distress Lungs: clear to auscultation bilaterally Heart: regular rate and rhythm Abdomen: soft, non-tender; bowel sounds normal Pelvic: 1.5/50/-2 Extremities: Homans sign is negative, no sign of DVT;  2+ edema DTR's 3+ Presentation: cephalic Fetal monitoring  140, avg  LTV, + accels, no decel Uterine activity   Not feeling ctx, but q 2-5 mintues  Foley placed and inflated w/60cc H20    Vitals:   06/05/16 1159 06/05/16 1205  BP: (!) 158/100 (!) 170/106  Pulse:        Prenatal labs: ABO, Rh: O/Positive/-- (02/08 1228) Antibody: Negative (06/26 0925) Rubella: immune RPR: Non Reactive (06/26 0925)  HBsAg: Negative (02/08 1228)  HIV: Non Reactive (06/26 0925)  GBS: Negative (08/25 1300)    Results for orders placed or performed in visit on 06/05/16 (from the past 24 hour(s))  POCT urinalysis dipstick   Collection Time: 06/05/16 11:33  AM  Result Value Ref Range   Color, UA yellow    Clarity, UA clear    Glucose, UA neg    Bilirubin, UA     Ketones, UA neg    Spec Grav, UA     Blood, UA neg    pH, UA     Protein, UA trace    Urobilinogen, UA     Nitrite, UA neg    Leukocytes, UA Negative Negative    Assessment: Cristina [name] is a 30 y.o. [id number] with an IUP at [redacted]w[redacted]d presenting for IOL for GHTN.  Plan: #Labor: Foley->pitocin #Pain:  Per request #FWB Cat 1 #ID: GBS: neg  #MOF:  breast #MOC: POPs #Circ: na   CRESENZO-DISHMAN,Kilani Joffe 06/05/2016, 8:23 PM

## 2016-06-05 NOTE — Anesthesia Pain Management Evaluation Note (Signed)
  [facility] Pain Management Visit Note  Patient: Cristina SentersAdrienne D [name], 30 y.o., female  "Hello I am a [facility] of the anesthesia team at [facility]. We have an anesthesia team available at all times to provide care throughout the hospital, including epidural management and anesthesia for C-section. I don't know your plan for the delivery whether it a natural birth, water birth, IV sedation, nitrous supplementation, doula or epidural, but we want to meet your pain goals."   1.Was your pain managed to your expectations on prior hospitalizations?   Yes   2.What is your expectation for pain management during this hospitalization?     Epidural  3.How can we help you reach that goal? Epidural when needed.  Record the patient's initial score and the patient's pain goal.   Pain: 1  Pain Goal: 6 [facility] wants you to be able to say your pain was always managed very well.  Jaeven Wanzer 06/05/2016

## 2016-06-05 NOTE — H&P (Deleted)
  The note originally documented on this encounter has been moved the the encounter in which it belongs.  

## 2016-06-05 NOTE — Progress Notes (Signed)
[id number] 2515w6d Estimated Date of Delivery: 06/06/16  Blood pressure (!) 170/106, pulse 74, weight 201 lb (91.2 kg), last menstrual period 08/31/2015.   BP weight and urine results all reviewed and noted.  Please refer to the obstetrical flow sheet for the fundal height and fetal heart rate documentation:  Patient reports good fetal movement, denies any bleeding and no rupture of membranes symptoms or regular contractions. Patient is without complaints. Denies HA/RUQ pain/vision changes.   All questions were answered.  Orders Placed This Encounter  Procedures  . POCT urinalysis dipstick    Plan:  Discussed w/[facility]HE and Anyanwu.  Direct admit for IOL  Return in about 6 weeks (around 07/17/2016) for POST PARTUM CHECKUP.

## 2016-06-06 ENCOUNTER — Inpatient Hospital Stay (HOSPITAL_COMMUNITY): Payer: Medicaid Other | Admitting: Anesthesiology

## 2016-06-06 ENCOUNTER — Encounter (HOSPITAL_COMMUNITY): Payer: Self-pay | Admitting: *Deleted

## 2016-06-06 DIAGNOSIS — O134 Gestational [pregnancy-induced] hypertension without significant proteinuria, complicating childbirth: Secondary | ICD-10-CM

## 2016-06-06 DIAGNOSIS — Z3A40 40 weeks gestation of pregnancy: Secondary | ICD-10-CM

## 2016-06-06 DIAGNOSIS — Z87891 Personal history of nicotine dependence: Secondary | ICD-10-CM

## 2016-06-06 LAB — CBC
HCT: 35.7 % — ABNORMAL LOW (ref 36.0–46.0)
HEMATOCRIT: 37.7 % (ref 36.0–46.0)
HEMOGLOBIN: 12.8 g/dL (ref 12.0–15.0)
Hemoglobin: 13.2 g/dL (ref 12.0–15.0)
MCH: 31.7 pg (ref 26.0–34.0)
MCH: 31.1 pg (ref 26.0–34.0)
MCHC: 35.9 g/dL (ref 30.0–36.0)
MCHC: 35 g/dL (ref 30.0–36.0)
MCV: 88.4 fL (ref 78.0–100.0)
MCV: 88.9 fL (ref 78.0–100.0)
Platelets: 179 10*3/uL (ref 150–400)
Platelets: 186 10*3/uL (ref 150–400)
RBC: 4.04 MIL/uL (ref 3.87–5.11)
RBC: 4.24 MIL/uL (ref 3.87–5.11)
RDW: 13.1 % (ref 11.5–15.5)
RDW: 13.2 % (ref 11.5–15.5)
WBC: 8.7 10*3/uL (ref 4.0–10.5)
WBC: 12.6 10*3/uL — ABNORMAL HIGH (ref 4.0–10.5)

## 2016-06-06 LAB — RPR: RPR Ser Ql: NONREACTIVE

## 2016-06-06 MED ORDER — DIBUCAINE 1 % RE OINT: 1.0000 "application " | TOPICAL_OINTMENT | RECTAL | Status: DC | PRN

## 2016-06-06 MED ORDER — ONDANSETRON HCL 4 MG/2ML IJ SOLN: 4.0000 mg | INTRAMUSCULAR | Status: DC | PRN

## 2016-06-06 MED ORDER — TETANUS-DIPHTH-ACELL PERTUSSIS 5-2.5-18.5 LF-MCG/0.5 IM SUSP: 0.5000 mL | Freq: Once | INTRAMUSCULAR | Status: DC

## 2016-06-06 MED ORDER — PHENYLEPHRINE 40 MCG/ML (10ML) SYRINGE FOR IV PUSH (FOR BLOOD PRESSURE SUPPORT)
80.0000 ug | PREFILLED_SYRINGE | INTRAVENOUS | Status: DC | PRN
  Filled 2016-06-06: qty 5

## 2016-06-06 MED ORDER — ACETAMINOPHEN 325 MG PO TABS: 650.0000 mg | ORAL_TABLET | ORAL | Status: DC | PRN

## 2016-06-06 MED ORDER — FENTANYL 2.5 MCG/ML BUPIVACAINE 1/10 % EPIDURAL INFUSION (WH - ANES)
14.0000 mL/h | INTRAMUSCULAR | Status: DC | PRN
  Administered 2016-06-06 (×3): 14 mL/h via EPIDURAL
  Filled 2016-06-06 (×2): qty 125

## 2016-06-06 MED ORDER — DIPHENHYDRAMINE HCL 50 MG/ML IJ SOLN: 12.5000 mg | INTRAMUSCULAR | Status: DC | PRN

## 2016-06-06 MED ORDER — DIPHENHYDRAMINE HCL 25 MG PO CAPS: 25.0000 mg | ORAL_CAPSULE | Freq: Four times a day (QID) | ORAL | Status: DC | PRN

## 2016-06-06 MED ORDER — SENNOSIDES-DOCUSATE SODIUM 8.6-50 MG PO TABS
2.0000 | ORAL_TABLET | ORAL | Status: DC
  Administered 2016-06-07 – 2016-06-08 (×3): 2 via ORAL
  Filled 2016-06-06 (×3): qty 2

## 2016-06-06 MED ORDER — BENZOCAINE-MENTHOL 20-0.5 % EX AERO
1.0000 "application " | INHALATION_SPRAY | CUTANEOUS | Status: DC | PRN
  Administered 2016-06-06: 1 via TOPICAL
  Filled 2016-06-06: qty 56

## 2016-06-06 MED ORDER — TRAMADOL HCL 50 MG PO TABS: 50.0000 mg | ORAL_TABLET | Freq: Four times a day (QID) | ORAL | Status: DC | PRN

## 2016-06-06 MED ORDER — FENTANYL 2.5 MCG/ML BUPIVACAINE 1/10 % EPIDURAL INFUSION (WH - ANES): 14.0000 mL/h | INTRAMUSCULAR | Status: DC | PRN

## 2016-06-06 MED ORDER — LACTATED RINGERS IV SOLN
500.0000 mL | Freq: Once | INTRAVENOUS | Status: AC
  Administered 2016-06-06: 04:00:00 via INTRAVENOUS

## 2016-06-06 MED ORDER — WITCH HAZEL-GLYCERIN EX PADS: 1.0000 "application " | MEDICATED_PAD | CUTANEOUS | Status: DC | PRN

## 2016-06-06 MED ORDER — PHENYLEPHRINE 40 MCG/ML (10ML) SYRINGE FOR IV PUSH (FOR BLOOD PRESSURE SUPPORT)
80.0000 ug | PREFILLED_SYRINGE | INTRAVENOUS | Status: DC | PRN
  Filled 2016-06-06: qty 10
  Filled 2016-06-06: qty 5

## 2016-06-06 MED ORDER — PRENATAL MULTIVITAMIN CH
1.0000 | ORAL_TABLET | Freq: Every day | ORAL | Status: DC
  Administered 2016-06-07 – 2016-06-08 (×2): 1 via ORAL
  Filled 2016-06-06 (×2): qty 1

## 2016-06-06 MED ORDER — EPHEDRINE 5 MG/ML INJ
10.0000 mg | INTRAVENOUS | Status: DC | PRN
  Filled 2016-06-06: qty 4

## 2016-06-06 MED ORDER — LIDOCAINE HCL (PF) 1 % IJ SOLN
INTRAMUSCULAR | Status: DC | PRN
  Administered 2016-06-06 (×2): 4 mL

## 2016-06-06 MED ORDER — SIMETHICONE 80 MG PO CHEW: 80.0000 mg | CHEWABLE_TABLET | ORAL | Status: DC | PRN

## 2016-06-06 MED ORDER — IBUPROFEN 600 MG PO TABS
600.0000 mg | ORAL_TABLET | Freq: Four times a day (QID) | ORAL | Status: DC
  Administered 2016-06-06 – 2016-06-09 (×11): 600 mg via ORAL
  Filled 2016-06-06 (×11): qty 1

## 2016-06-06 MED ORDER — ZOLPIDEM TARTRATE 5 MG PO TABS: 5.0000 mg | ORAL_TABLET | Freq: Every evening | ORAL | Status: DC | PRN

## 2016-06-06 MED ORDER — COCONUT OIL OIL: 1.0000 "application " | TOPICAL_OIL | Status: DC | PRN

## 2016-06-06 MED ORDER — ONDANSETRON HCL 4 MG PO TABS: 4.0000 mg | ORAL_TABLET | ORAL | Status: DC | PRN

## 2016-06-06 NOTE — Progress Notes (Signed)
   Cristina [name] is a 30 y.o. [id number] at 4464w0d  admitted for induction of labor due to Hypertension.  Subjective: Comfortable with epidural  Objective: Vitals:   06/06/16 0446 06/06/16 0448 06/06/16 0450 06/06/16 0451  BP: 121/85 121/85  (!) 130/91  Pulse: 80 80 71 67  Resp:      Temp:      TempSrc:      SpO2:  100% 100%   Weight:      Height:       No intake/output data recorded.  FHT:  FHR: 145 bpm, variability: moderate,  accelerations:  Present,  decelerations:  Absent UC:   irregular, every 2-5 minutes SVE:   Dilation: 5 Effacement (%): 70 Station: -2 Exam by:: amwalker,rn Foley felel out about an hour ago. Ctx are getting stronger, so will hold off on pitocin.   Labs: Lab Results  Component Value Date   WBC 8.7 06/06/2016   HGB 12.8 06/06/2016   HCT 35.7 (L) 06/06/2016   MCV 88.4 06/06/2016   PLT 179 06/06/2016    Assessment / Plan: IOL GHTN, progressing well  Labor: Progressing normally Fetal Wellbeing:  Category I Pain Control:  Epidural Anticipated MOD:  NSVD  CRESENZO-DISHMAN,Cristina [name] 06/06/2016, 5:00 AM

## 2016-06-06 NOTE — Anesthesia Procedure Notes (Signed)
Epidural Patient location during procedure: OB  Staffing Anesthesiologist: Britnie Colville Performed: anesthesiologist   Preanesthetic Checklist Completed: patient identified, pre-op evaluation, timeout performed, IV checked, risks and benefits discussed and monitors and equipment checked  Epidural Patient position: sitting Prep: site prepped and draped and DuraPrep Patient monitoring: heart rate Approach: midline Location: L3-L4 Injection technique: LOR air and LOR saline  Needle:  Needle type: Tuohy  Needle gauge: 17 G Needle length: 9 cm Needle insertion depth: 6 cm Catheter type: closed end flexible Catheter size: 19 Gauge Catheter at skin depth: 12 cm Test dose: negative  Assessment Sensory level: T8 Events: blood not aspirated, injection not painful, no injection resistance, negative IV test and no paresthesia  Additional Notes Reason for block:procedure for pain     

## 2016-06-06 NOTE — Progress Notes (Signed)
Patient ID: Cristina Sentersdrienne D Cobb, female   DOB: 08/22/1986, 30 y.o.   MRN: [id number] OB Attending Pt comfortable with epidural. AROM, thick meconium 7-8/70%/-2 Continue with expectant management

## 2016-06-06 NOTE — Anesthesia Preprocedure Evaluation (Signed)
Anesthesia Evaluation  Patient identified by MRN, date of birth, ID band Patient awake    Reviewed: Allergy & Precautions, NPO status , Patient's Chart, lab work & pertinent test results  Airway Mallampati: I  TM Distance: >3 FB     Dental  (+) Teeth Intact   Pulmonary former smoker,    breath sounds clear to auscultation       Cardiovascular hypertension,  Rhythm:Regular Rate:Normal     Neuro/Psych    GI/Hepatic negative GI ROS, Neg liver ROS,   Endo/Other    Renal/GU negative Renal ROS     Musculoskeletal   Abdominal   Peds  Hematology negative hematology ROS (+)   Anesthesia Other Findings Solid food 3 hrs ago  Reproductive/Obstetrics (+) Pregnancy                             Anesthesia Physical  Anesthesia Plan  ASA: II  Anesthesia Plan: Epidural   Post-op Pain Management:    Induction:   Airway Management Planned:   Additional Equipment:   Intra-op Plan:   Post-operative Plan:   Informed Consent: I have reviewed the patients History and Physical, chart, labs and discussed the procedure including the risks, benefits and alternatives for the proposed anesthesia with the patient or authorized representative who has indicated his/her understanding and acceptance.     Plan Discussed with:   Anesthesia Plan Comments:         Anesthesia Quick Evaluation

## 2016-06-06 NOTE — Lactation Note (Signed)
This note was copied from a baby's chart. Lactation Consultation Note  Patient Name: Cristina [name] ZOXWR'UToday's Date: 06/06/2016 Reason for consult: Initial assessment   Initial consult with Exp BF mom [facility] < [facility]. Infant was STS with mom and cueing to feed. Mom BF her 89 yo for 4 months at which time she felt she was not making enough milk. Discussed supply and demand, colostrum, NB nutritional needs, and milk coming to volume. Enc mom to feed STS 8-12 x in 24 hours at first feeding cues. Mom is aware of feeding cues.   Mom with large compressible breasts and small short shafted everted nipples. Colostrum easily expressible from both breasts, showed mom how to hand express. Infant latched easily with flanged lips on the left breast. She was noted to have rhythmic suckling and intermittent swallows. Infant was then examined by NNP and weighed and measured, I then assisted mom in latching her to the right breast in the cross cradle hold and she was actively feeding when I left the room.  BF Resources and [facility] given, mom informed of BF Support Groups, IP/OP Services, and LC phone #. Enc mom to call with questions/concerns prn.    Maternal Data Formula Feeding for Exclusion: No Has patient been taught Hand Expression?: Yes Does the patient have breastfeeding experience prior to this delivery?: Yes  Feeding Feeding Type: Breast Fed Length of feed: 25 min  LATCH Score/Interventions Latch: Grasps breast easily, tongue down, lips flanged, rhythmical sucking.  Audible Swallowing: Spontaneous and intermittent  Type of Nipple: Everted at rest and after stimulation  Comfort (Breast/Nipple): Soft / non-tender     Hold (Positioning): Assistance needed to correctly position infant at breast and maintain latch. Intervention(s): Breastfeeding basics reviewed;Support Pillows;Position options;Skin to skin  LATCH Score: 9  Lactation Tools Discussed/Used      Consult Status Consult Status: Follow-up Date: 06/07/16 Follow-up type: In-patient    Silas FloodSharon S Anginette Espejo 06/06/2016, 4:13 PM

## 2016-06-06 NOTE — Anesthesia Postprocedure Evaluation (Signed)
Anesthesia Post Note  Patient: Cristina [name]  Procedure(s) Performed: * No procedures listed *  Patient location during evaluation: Mother Baby Anesthesia Type: Epidural Level of consciousness: awake and alert Pain management: satisfactory to patient Vital Signs Assessment: post-procedure vital signs reviewed and stable Respiratory status: respiratory function stable Cardiovascular status: stable Postop Assessment: no headache, no backache, epidural receding, patient able to bend at knees, no signs of nausea or vomiting and adequate PO intake Anesthetic complications: no     Last Vitals:  Vitals:   06/06/16 1719 06/06/16 1809  BP: (!) 148/56 137/77  Pulse: (!) 54 (!) 58  Resp: 18 20  Temp: 36.6 C 36.8 C    Last Pain:  Vitals:   06/06/16 2010  TempSrc:   PainSc: 0-No pain   Pain Goal: Patients Stated Pain Goal: 2 (06/06/16 0436)               Karleen DolphinFUSSELL,[place] Oborn

## 2016-06-06 NOTE — Progress Notes (Signed)
   Cristina [name] is a 30 y.o. [id number] at 8349w0d  ad[facility]  Subjective: Feels "crampy"  Objective: Vitals:   06/05/16 1952 06/05/16 1954 06/05/16 2303  BP:  (!) 148/83 (!) 141/81  Pulse:  75 77  Resp: 18  18  Temp: 98.5 F (36.9 C)    TempSrc: Oral    Weight: 91.2 kg (201 lb)    Height: 5\' 4"  (1.626 m)     No intake/output data recorded.  FHT:  FHR: 149 bpm, variability: moderate,  accelerations:  Present,  decelerations:  Absent UC:   Irritability, q 1-3 mintrues SVE:   Dilation: 1.5 Effacement (%): 50 Station: -2 Exam by:: fran cresenzo   Labs: Lab Results  Component Value Date   WBC 9.3 06/05/2016   HGB 13.2 06/05/2016   HCT 37.0 06/05/2016   MCV 89.2 06/05/2016   PLT 202 06/05/2016    Assessment / Plan: IOL, ripening phase  Labor: ripening phase Fetal Wellbeing:  Category I Pain Control:  Labor support without medications Anticipated MOD:  NSVD  CRESENZO-DISHMAN,[place] Woolbright 06/06/2016, 12:53 AM

## 2016-06-07 NOTE — Lactation Note (Signed)
This note was copied from a baby's chart. Lactation Consultation Note: Follow up visit with this experienced BF mom. Baby now 2525 hours old. No stool since delivery- had Mec stained fluid at delivery. Mom reports breast feeding is going better today. Baby waking after RN assessment. Undressed and placed skin to skin. Mom needed some assist with hand expression. Baby latched well. Mom needed reinforcement of basic breast feeding teaching. No questions at present. Has Lansinol pump for home. To call for assist prn  Patient Name: Cristina [name] WUJWJ'XToday's Date: 06/07/2016 Reason for consult: Follow-up assessment   Maternal Data Formula Feeding for Exclusion: No Has patient been taught Hand Expression?: Yes Does the patient have breastfeeding experience prior to this delivery?: Yes  Feeding Feeding Type: Breast Fed  LATCH Score/Interventions Latch: Grasps breast easily, tongue down, lips flanged, rhythmical sucking.  Audible Swallowing: A few with stimulation  Type of Nipple: Everted at rest and after stimulation  Comfort (Breast/Nipple): Soft / non-tender     Hold (Positioning): Assistance needed to correctly position infant at breast and maintain latch. Intervention(s): Breastfeeding basics reviewed;Support Pillows  LATCH Score: 8  Lactation Tools Discussed/Used WIC Program: No   Consult Status Consult Status: Follow-up Date: 06/08/16 Follow-up type: In-patient    Cristina [name], Cristina [name] 06/07/2016, 4:03 PM

## 2016-06-07 NOTE — Progress Notes (Signed)
Post Partum Day #1 Subjective: no complaints, up ad lib and tolerating PO; breastfeeding going well; desires POPs for contraception  Objective: Blood pressure 136/84, pulse 62, temperature 97.7 F (36.5 C), temperature source Oral, resp. rate 18, height 5\' 4"  (1.626 m), weight 91.2 kg (201 lb), last menstrual period 08/31/2015, SpO2 100 %, unknown if currently breastfeeding.  Physical Exam:  General: alert and cooperative Lochia: appropriate Uterine Fundus: firm DVT Evaluation: No evidence of DVT seen on physical exam.   Recent Labs  06/06/16 0345 06/06/16 1601  HGB 12.8 13.2  HCT 35.7* 37.7    Assessment/Plan: Plan for discharge tomorrow   LOS: 2 days   Cristina [name], Chasey Dull CNM 06/07/2016, 8:50 AM

## 2016-06-08 MED ORDER — AMLODIPINE BESYLATE 10 MG PO TABS: 10.0000 mg | ORAL_TABLET | Freq: Every day | ORAL | 0 refills | Status: DC

## 2016-06-08 MED ORDER — HYDRALAZINE HCL 20 MG/ML IJ SOLN: 10.0000 mg | Freq: Once | INTRAMUSCULAR | Status: DC

## 2016-06-08 MED ORDER — AMLODIPINE BESYLATE 10 MG PO TABS: 10.0000 mg | ORAL_TABLET | Freq: Every day | ORAL | Status: DC

## 2016-06-08 MED ORDER — AMLODIPINE BESYLATE 10 MG PO TABS
10.0000 mg | ORAL_TABLET | Freq: Every day | ORAL | Status: DC
  Administered 2016-06-08 – 2016-06-09 (×2): 10 mg via ORAL
  Filled 2016-06-08 (×2): qty 1

## 2016-06-08 MED ORDER — HYDRALAZINE HCL 20 MG/ML IJ SOLN
10.0000 mg | Freq: Once | INTRAMUSCULAR | Status: AC
  Administered 2016-06-08: 10 mg via INTRAMUSCULAR
  Filled 2016-06-08: qty 1

## 2016-06-08 NOTE — Discharge Instructions (Signed)
Call the office ([phone]) [facility] for these signs of pre-ecl[facility]: °· Severe headache that does not go away with Tylenol °· Visual changes- seeing spots, double, blurred vision °· Pain under your right breast or upper abdomen that does not go away with Tums or heartburn medicine °· Nausea and/or vomiting °· Severe swelling in your hands, feet, and face  ° °    °Postpartum Care After Vaginal Delivery °After you deliver your newborn (postpartum period), the usual stay in the hospital is 24-72 hours. If there were problems with your labor or delivery, or if you have other medical problems, you might be in the hospital longer.  °While you are in the hospital, you will receive help and instructions on how to care for yourself and your newborn during the postpartum period.  °While you are in the hospital: °· Be sure to tell your nurses if you have pain or discomfort, as well as where you feel the pain and what makes the pain worse. °· If you had an incision made near your vagina (episiotomy) or if you had some tearing during delivery, the nurses may put ice packs on your episiotomy or tear. The ice packs may help to reduce the pain and swelling. °· If you are breastfeeding, you may feel uncomfortable contractions of your uterus for a couple of weeks. This is normal. The contractions help your uterus get back to normal size. °· It is normal to have some bleeding after delivery. °¨ For the first 1-3 days after delivery, the flow is red and the amount may be similar to a period. °¨ It is common for the flow to start and stop. °¨ In the first few days, you may pass some small clots. Let your nurses know if you begin to pass large clots or your flow increases. °¨ Do not  flush blood clots down the toilet before having the nurse look at them. °¨ During the next 3-10 days after delivery, your flow should become more watery and pink or brown-tinged in color. °¨ Ten to fourteen days after delivery, your flow  should be a small amount of yellowish-white discharge. °¨ The amount of your flow will decrease over the first few weeks after delivery. Your flow may stop in 6-8 weeks. Most women have had their flow stop by 12 weeks after delivery. °· You should change your sanitary pads frequently. °· Wash your hands thoroughly with soap and water for at least 20 seconds after changing pads, using the toilet, or before holding or feeding your newborn. °· You should feel like you need to empty your bladder within the first 6-8 hours after delivery. °· In case you become weak, lightheaded, or faint, call your nurse before you get out of bed for the first time and before you take a shower for the first time. °· Within the first few days after delivery, your breasts may begin to feel tender and full. This is called engorgement. Breast tenderness usually goes away within 48-72 hours after engorgement occurs. You may also notice milk leaking from your breasts. If you are not breastfeeding, do not stimulate your breasts. Breast stimulation can make your breasts produce more milk. °· Spending as much time as possible with your newborn is very important. During this time, you and your newborn can feel close and get to know each other. Having your newborn stay in your room (rooming in) will help to strengthen the bond with your newborn.  It will give you   time to get to know your newborn and become comfortable caring for your newborn. °· Your hormones change after delivery. Sometimes the hormone changes can temporarily cause you to feel sad or tearful. These feelings should not last more than a few days. If these feelings last longer than that, you should talk to your caregiver. °· If desired, talk to your caregiver about methods of family planning or contraception. °· Talk to your caregiver about immunizations. Your caregiver may want you to have the following immunizations before leaving the hospital: °¨ Tetanus, diphtheria, and pertussis  (Tdap) or tetanus and diphtheria (Td) immunization. It is very important that you and your family (including grandparents) or others caring for your newborn are up-to-date with the Tdap or Td immunizations. The Tdap or Td immunization can help protect your newborn from getting ill. °¨ Rubella immunization. °¨ Varicella (chickenpox) immunization. °¨ Influenza immunization. You should receive this annual immunization if you did not receive the immunization during your pregnancy. °  °This information is not intended to replace advice given to you by your health care provider. Make sure you discuss any questions you have with your health care provider. °  °Document Released: 07/06/2007 Document Revised: 06/02/2012 Document Reviewed: 05/05/2012 °Elsevier Interactive Patient Education ©2016 [employer] ° ° °Breastfeeding °Deciding to breastfeed is one of the best choices you can make for you and your baby. A change in hormones during pregnancy causes your breast tissue to grow and increases the number and size of your milk ducts. These hormones also allow proteins, sugars, and fats from your blood supply to make breast milk in your milk-producing glands. Hormones prevent breast milk from being released before your baby is born as well as prompt milk flow after birth. Once breastfeeding has begun, thoughts of your baby, as well as his or her sucking or crying, can stimulate the release of milk from your milk-producing glands.  °BENEFITS OF BREASTFEEDING °For Your Baby °· Your first milk (colostrum) helps your baby's digestive system function better. °· There are antibodies in your milk that help your baby fight off infections. °· Your baby has a lower incidence of asthma, allergies, and sudden infant death syndrome. °· The nutrients in breast milk are better for your baby than infant formulas and are designed uniquely for your baby's needs. °· Breast milk improves your baby's brain development. °· Your baby is less  likely to develop other conditions, such as childhood obesity, asthma, or type 2 diabetes mellitus. °For You °· Breastfeeding helps to create a very special bond between you and your baby. °· Breastfeeding is convenient. Breast milk is always available at the correct temperature and costs nothing. °· Breastfeeding helps to burn calories and helps you lose the weight gained during pregnancy. °· Breastfeeding makes your uterus contract to its prepregnancy size faster and slows bleeding (lochia) after you give birth.   °· Breastfeeding helps to lower your risk of developing type 2 diabetes mellitus, osteoporosis, and breast or ovarian cancer later in life. °SIGNS THAT YOUR BABY IS HUNGRY °Early Signs of Hunger °· Increased alertness or activity. °· Stretching. °· Movement of the head from side to side. °· Movement of the head and opening of the mouth when the corner of the mouth or cheek is stroked (rooting). °· Increased sucking sounds, smacking lips, cooing, sighing, or squeaking. °· Hand-to-mouth movements. °· Increased sucking of fingers or hands. °Late Signs of Hunger °· Fussing. °· Intermittent crying. °Extreme Signs of Hunger °Signs of extreme hunger will require   calming and consoling before your baby will be able to breastfeed successfully. Do not wait for the following signs of extreme hunger to occur before you initiate breastfeeding: °· Restlessness. °· A loud, strong cry. °· Screaming. °BREASTFEEDING BASICS °Breastfeeding Initiation °· Find a comfortable place to sit or lie down, with your neck and back well supported. °· Place a pillow or rolled up blanket under your baby to bring him or her to the level of your breast (if you are seated). Nursing pillows are specially designed to help support your arms and your baby while you breastfeed. °· Make sure that your baby's abdomen is facing your abdomen. °· Gently massage your breast. With your fingertips, massage from your chest wall toward your nipple in a  circular motion. This encourages milk flow. You may need to continue this action during the feeding if your milk flows slowly. °· Support your breast with 4 fingers underneath and your thumb above your nipple. Make sure your fingers are well away from your nipple and your baby's mouth. °· Stroke your baby's lips gently with your finger or nipple. °· When your baby's mouth is open wide enough, quickly bring your baby to your breast, placing your entire nipple and as much of the colored area around your nipple (areola) as possible into your baby's mouth. °¨ More areola should be visible above your baby's upper lip than below the lower lip. °¨ Your baby's tongue should be between his or her lower gum and your breast. °· Ensure that your baby's mouth is correctly positioned around your nipple (latched). Your baby's lips should create a seal on your breast and be turned out (everted). °· It is common for your baby to suck about 2-3 minutes in order to start the flow of breast milk. °Latching °Teaching your baby how to latch on to your breast properly is very important. An improper latch can cause nipple pain and decreased milk supply for you and poor weight gain in your baby. Also, if your baby is not latched onto your nipple properly, he or she may swallow some air during feeding. This can make your baby fussy. Burping your baby when you switch breasts during the feeding can help to get rid of the air. However, teaching your baby to latch on properly is still the best way to prevent fussiness from swallowing air while breastfeeding. °Signs that your baby has successfully latched on to your nipple: °· Silent tugging or silent sucking, without causing you pain. °· Swallowing heard between every 3-4 sucks. °· Muscle movement above and in front of his or her ears while sucking. °Signs that your baby has not successfully latched on to nipple: °· Sucking sounds or smacking sounds from your baby while breastfeeding. °· Nipple  pain. °If you think your baby has not latched on correctly, slip your finger into the corner of your baby's mouth to break the suction and place it between your baby's gums. Attempt breastfeeding initiation again. °Signs of Successful Breastfeeding °Signs from your baby: °· A gradual decrease in the number of sucks or complete cessation of sucking. °· Falling asleep. °· Relaxation of his or her body. °· Retention of a small amount of milk in his or her mouth. °· Letting go of your breast by himself or herself. °Signs from you: °· Breasts that have increased in firmness, weight, and size 1-3 hours after feeding. °· Breasts that are softer immediately after breastfeeding. °· Increased milk volume, as well as a change in   milk consistency and color by the fifth day of breastfeeding. °· Nipples that are not sore, cracked, or bleeding. °Signs That Your Baby is Getting Enough Milk °· Wetting at least 3 diapers in a 24-hour period. The urine should be clear and pale yellow by age 5 days. °· At least 3 stools in a 24-hour period by age 5 days. The stool should be soft and yellow. °· At least 3 stools in a 24-hour period by age 7 days. The stool should be seedy and yellow. °· No loss of weight greater than 10% of birth weight during the first 3 days of age. °· Average weight gain of 4-7 ounces (113-198 g) per week after age 4 days. °· Consistent daily weight gain by age 5 days, without weight loss after the age of 2 weeks. °After a feeding, your baby may spit up a small amount. This is common. °BREASTFEEDING FREQUENCY AND DURATION °Frequent feeding will help you make more milk and can prevent sore nipples and breast engorgement. Breastfeed when you feel the need to reduce the fullness of your breasts or when your baby shows signs of hunger. This is called "breastfeeding on demand." Avoid introducing a pacifier to your baby while you are working to establish breastfeeding (the first 4-6 weeks after your baby is born). After  this time you may choose to use a pacifier. Research has shown that pacifier use during the first year of a baby's life decreases the risk of sudden infant death syndrome (SIDS). °Allow your baby to feed on each breast as long as he or she wants. Breastfeed until your baby is finished feeding. When your baby unlatches or falls asleep while feeding from the first breast, offer the second breast. Because newborns are often sleepy in the first few weeks of life, you may need to awaken your baby to get him or her to feed. °Breastfeeding times will vary from baby to baby. However, the following rules can serve as a guide to help you ensure that your baby is properly fed: °· Newborns (babies 4 weeks of age or younger) may breastfeed every 1-3 hours. °· Newborns should not go longer than 3 hours during the day or 5 hours during the night without breastfeeding. °· You should breastfeed your baby a minimum of 8 times in a 24-hour period until you begin to introduce solid foods to your baby at around 6 months of age. °BREAST MILK PUMPING °Pumping and storing breast milk allows you to ensure that your baby is exclusively fed your breast milk, even at times when you are unable to breastfeed. This is especially important if you are going back to work while you are still breastfeeding or when you are not able to be present during feedings. Your lactation consultant can give you guidelines on how long it is safe to store breast milk. °A breast pump is a machine that allows you to pump milk from your breast into a sterile bottle. The pumped breast milk can then be stored in a refrigerator or freezer. Some breast pumps are operated by hand, while others use electricity. Ask your lactation consultant which type will work best for you. Breast pumps can be purchased, but some hospitals and breastfeeding support groups lease breast pumps on a monthly basis. A lactation consultant can teach you how to hand express breast milk, if you  prefer not to use a pump. °CARING FOR YOUR BREASTS WHILE YOU BREASTFEED °Nipples can become dry, cracked, and sore while breastfeeding. The   following recommendations can help keep your breasts moisturized and healthy: °· Avoid using soap on your nipples. °· Wear a supportive bra. Although not required, special nursing bras and tank tops are designed to allow access to your breasts for breastfeeding without taking off your entire bra or top. Avoid wearing underwire-style bras or extremely tight bras. °· Air dry your nipples for 3-4 minutes after each feeding. °· Use only cotton bra pads to absorb leaked breast milk. Leaking of breast milk between feedings is normal. °· Use lanolin on your nipples after breastfeeding. Lanolin helps to maintain your skin's normal moisture barrier. If you use pure lanolin, you do not need to wash it off before feeding your baby again. Pure lanolin is not toxic to your baby. You may also hand express a few drops of breast milk and gently massage that milk into your nipples and allow the milk to air dry. °In the first few weeks after giving birth, some women experience extremely full breasts (engorgement). Engorgement can make your breasts feel heavy, warm, and tender to the touch. Engorgement peaks within 3-5 days after you give birth. The following recommendations can help ease engorgement: °· Completely empty your breasts while breastfeeding or pumping. You may want to start by applying warm, moist heat (in the shower or with warm water-soaked hand towels) just before feeding or pumping. This increases circulation and helps the milk flow. If your baby does not completely empty your breasts while breastfeeding, pump any extra milk after he or she is finished. °· Wear a snug bra (nursing or regular) or tank top for 1-2 days to signal your body to slightly decrease milk production. °· Apply ice packs to your breasts, unless this is too uncomfortable for you. °· Make sure that your baby is  latched on and positioned properly while breastfeeding. °If engorgement persists after 48 hours of following these recommendations, contact your health care provider or a lactation consultant. °OVERALL HEALTH CARE RECOMMENDATIONS WHILE BREASTFEEDING °· Eat healthy foods. Alternate between meals and snacks, eating 3 of each per day. Because what you eat affects your breast milk, some of the foods may make your baby more irritable than usual. Avoid eating these foods if you are sure that they are negatively affecting your baby. °· Drink milk, fruit juice, and water to satisfy your thirst (about 10 glasses a day). °· Rest often, relax, and continue to take your prenatal vitamins to prevent fatigue, stress, and anemia. °· Continue breast self-awareness checks. °· Avoid chewing and smoking tobacco. Chemicals from cigarettes that pass into breast milk and exposure to secondhand smoke may harm your baby. °· Avoid alcohol and drug use, including marijuana. °Some medicines that may be harmful to your baby can pass through breast milk. It is important to ask your health care provider before taking any medicine, including all over-the-counter and prescription medicine as well as vitamin and herbal supplements. °It is possible to become pregnant while breastfeeding. If birth control is desired, ask your health care provider about options that will be safe for your baby. °SEEK MEDICAL CARE IF: °· You feel like you want to stop breastfeeding or have become frustrated with breastfeeding. °· You have painful breasts or nipples. °· Your nipples are cracked or bleeding. °· Your breasts are red, tender, or warm. °· You have a swollen area on either breast. °· You have a fever or chills. °· You have nausea or vomiting. °· You have drainage other than breast milk from your nipples. °· Your   breasts do not become full before feedings by the fifth day after you give birth. °· You feel sad and depressed. °· Your baby is too sleepy to eat  well. °· Your baby is having trouble sleeping.   °· Your baby is wetting less than 3 diapers in a 24-hour period. °· Your baby has less than 3 stools in a 24-hour period. °· Your baby's skin or the white part of his or her eyes becomes yellow.   °· Your baby is not gaining weight by 5 days of age. °SEEK IMMEDIATE MEDICAL CARE IF: °· Your baby is overly tired (lethargic) and does not want to wake up and feed. °· Your baby develops an unexplained fever. °  °This information is not intended to replace advice given to you by your health care provider. Make sure you discuss any questions you have with your health care provider. °  °Document Released: 09/08/2005 Document Revised: 05/30/2015 Document Reviewed: 03/02/2013 °Elsevier Interactive Patient Education ©2016 [employer] ° ° °

## 2016-06-08 NOTE — Progress Notes (Signed)
S: No pt complaints O: Notified by RN VSS, BP 160's/80-100's after Apresoline given; pt denies any Headache, epigastric pain, or vision changes.  A: Uncontrolled GHTN P: Give Apresoline 10mg  IM, Monitor VS Q4hr, and  will plan to discharge home tomorrow

## 2016-06-08 NOTE — Lactation Note (Signed)
This note was copied from a baby's chart. Lactation Consultation Note; Mom reports baby has been nursing better and that breasts are feeling a little heavier this morning.Asking about when to introduce bottles and pumping. Reviewed with mom to wait a few Mariana Goytia until baby is good at breast feeding. Has pump for home. Asking about visitors- encouraged to limit visitors so she can concentrate on breast feeding and get some rest. No further questions at present. Reviewed our phone number, OP appointments and [facility] as resources for support after DC  To call prn  Patient Name: Cristina [name] ZOXWR'UToday's Date: 06/08/2016 Reason for consult: Follow-up assessment   Maternal Data Formula Feeding for Exclusion: No Has patient been taught Hand Expression?: Yes Does the patient have breastfeeding experience prior to this delivery?: Yes  Feeding    LATCH Score/Interventions                      Lactation Tools Discussed/Used WIC Program: No   Consult Status Consult Status: Complete    Pamelia HoitWeeks, Avraham Benish D 06/08/2016, 2:02 PM

## 2016-06-08 NOTE — Discharge Summary (Signed)
OB Discharge Summary     Patient Name: Cristina [name] DOB: 04/11/1986 MRN: [id number]  Date of admission: 06/05/2016 Delivering MD: Cam HaiSHAW, KIMBERLY D   Date of discharge: 06/08/2016  Admitting diagnosis: INDUCTION Intrauterine pregnancy: [id number]     Secondary diagnosis:  Active Problems:   Gestational hypertension w/o significant proteinuria in 3rd trimester  Additional problems: mild shoulder dystocia, 45sec, no deficits     Discharge diagnosis: Term Pregnancy Delivered, Gestational Hypertension and mild shoulder dystocia                                                                                                Post partum procedures:none  Augmentation: Foley Balloon  Complications: mild shoulder dystocia  Hospital course:  Induction of Labor With Vaginal Delivery   30 y.o. yo [id number] at 3651w0d was admitted to the hospital 06/05/2016 for induction of labor.  Indication for induction: Gestational hypertension.  Patient had an uncomplicated labor course as follows: Membrane Rupture Time/Date: 9:31 AM ,06/06/2016   Intrapartum Procedures: Episiotomy: None [1]                                         Lacerations:  1st degree [2];Vaginal [6]  Patient had delivery of a Viable infant.  Information for the patient's newborn:  Allison QuarryCobb, Girl Hansel Starlingdrienne [[id number]][[id number]]  Delivery Method: Vaginal, Spontaneous Delivery (Filed from Delivery Summary)   06/06/2016  Details of delivery can be found in separate delivery note.  Patient had a routine postpartum course. Patient is discharged home 06/08/16. Eating, drinking, voiding, ambulating well.  +flatus.  Lochia and pain wnl.  Denies dizziness, lightheadedness, or sob. No complaints.  Denies ha, visual changes, ruq/epigastric pain, n/v.  Accidentally dropped baby this morning, was asleep on her chest and pt was asleep and baby rolled off bed- has been checked by peds, and per pt they say all is ok   Physical exam Vitals:   06/06/16 2201 06/07/16 0632  06/07/16 1730 06/08/16 0624  BP: 122/65 136/84 139/85 (!) 146/88  Pulse: 68 62 79 65  Resp: 15 18 18 18   Temp: 98.4 F (36.9 C) 97.7 F (36.5 C) 98.8 F (37.1 C) 98.1 F (36.7 C)  TempSrc: Oral Oral Oral Oral  SpO2:  100%    Weight:      Height:       General: alert, cooperative and no distress Lochia: appropriate Uterine Fundus: firm Incision: N/A DVT Evaluation: No evidence of DVT seen on physical exam. Negative Homan's sign. No cords or calf tenderness. 2+ BLE edema No clonus DTRs 2+  Labs: Lab Results  Component Value Date   WBC 12.6 (H) 06/06/2016   HGB 13.2 06/06/2016   HCT 37.7 06/06/2016   MCV 88.9 06/06/2016   PLT 186 06/06/2016   CMP Latest Ref Rng & Units 06/05/2016  Glucose 65 - 99 mg/dL 696(E126(H)  BUN 6 - 20 mg/dL 9  Creatinine 9.520.44 - 8.411.00 mg/dL 3.240.57  Sodium 401135 - 027145 mmol/L 133(L)  Potassium 3.5 -  5.1 mmol/L 3.3(L)  Chloride 101 - 111 mmol/L 104  CO2 22 - 32 mmol/L 21(L)  Calcium 8.9 - 10.3 mg/dL 9.1  Total Protein 6.5 - 8.1 g/dL 6.5  Total Bilirubin 0.3 - 1.2 mg/dL 0.8  Alkaline Phos 38 - 126 U/L 193(H)  AST 15 - 41 U/L 26  ALT 14 - 54 U/L 17    Discharge instruction: per After Visit Summary and "Baby and Me Booklet".  After visit meds:    Medication List    TAKE these medications   amLODipine 10 MG tablet Commonly known as:  NORVASC Take 1 tablet (10 mg total) by mouth daily.   prenatal vitamin w/FE, FA 27-1 MG Tabs tablet Take one tablet by mouth daily       Diet: routine diet  Activity: Advance as tolerated. Pelvic rest for 6 weeks.   Outpatient follow up:1wk for bp check, then 4-6wks for pp visit Follow up Appt:Future Appointments Date Time Provider Department Center  07/17/2016 9:30 AM Cheral Marker, CNM [facility]   Follow up Visit:No Follow-up on file.  Postpartum contraception: plans POPs  Newborn Data: Live born female  Birth Weight: 8 lb 14 oz (4025 g) APGAR: 8, 9  Baby Feeding:  Breast Disposition:home with mother Start Norvasc 10mg  daily today, will hold d/c until bp recheck to make sure responding well Discussed pre-e s/s w/ pt, reasons to return  06/08/2016 Marge Duncans, CNM

## 2016-06-09 NOTE — Progress Notes (Signed)
Post discharge chart review completed.  

## 2016-06-09 NOTE — Lactation Note (Signed)
This note was copied from a baby'[name] chart. Lactation Consultation Note  Patient Name: Cristina [name] ZOXWR'UToday'[name] Date: 06/09/2016 Reason for consult: Follow-up assessment Observed mom breastfeeding baby in cradle hold.  Baby latched well and nursing actively.  Reviewed milk coming to volume and treatment of engorgement.  Mom states she has learned a lot and feels comfortable with the feedings.  Outpatient lactation services and support information reviewed and encouraged prn.  Maternal Data    Feeding Feeding Type: Breast Fed  LATCH Score/Interventions Latch: Grasps breast easily, tongue down, lips flanged, rhythmical sucking.  Audible Swallowing: Spontaneous and intermittent  Type of Nipple: Everted at rest and after stimulation  Comfort (Breast/Nipple): Soft / non-tender     Hold (Positioning): No assistance needed to correctly position infant at breast.  LATCH Score: 10  Lactation Tools Discussed/Used     Consult Status Consult Status: Complete    Cristina [name], Cristina [name] 06/09/2016, 9:57 AM

## 2016-06-09 NOTE — Discharge Summary (Signed)
OB Discharge Summary                           Patient Name: Cristina [name] DOB: 01/12/1986 MRN: [id number]  Date of admission: 06/05/2016 Delivering MD: Cam HaiSHAW, KIMBERLY D   Date of discharge: 06/08/2016  Admitting diagnosis: INDUCTION Intrauterine pregnancy: 2546w0d     Secondary diagnosis:  Active Problems:   Gestational hypertension w/o significant proteinuria in 3rd trimester  Additional problems: mild shoulder dystocia, 45sec, no deficits                                      Discharge diagnosis: Term Pregnancy Delivered, Gestational Hypertension and mild shoulder dystocia                                                                                                Post partum procedures:none  Augmentation: Foley Balloon  Complications: mild shoulder dystocia  Hospital course:  Induction of Labor With Vaginal Delivery   30 y.o. yo [id number] at 346w0d was admitted to the hospital 06/05/2016 for induction of labor.  Indication for induction: Gestational hypertension.  Patient had an uncomplicated labor course as follows: Membrane Rupture Time/Date: 9:31 AM ,06/06/2016   Intrapartum Procedures: Episiotomy: None [1]                                         Lacerations:  1st degree [2];Vaginal [6]  Patient had delivery of a Viable infant.  Information for the patient's newborn:  Cristina [name], Cristina [name] [[id number]][[id number]]  Delivery Method: Vaginal, Spontaneous Delivery (Filed from Delivery Summary)   06/06/2016  Details of delivery can be found in separate delivery note.  Patient had a routine postpartum course. Patient is discharged home 06/08/16. Eating, drinking, voiding, ambulating well.  +flatus.  Lochia and pain wnl.  Denies dizziness, lightheadedness, or sob. No complaints.  Denies ha, visual changes, ruq/epigastric pain, n/v.  Accidentally dropped baby this morning, was asleep on her chest and pt was asleep and baby rolled off bed- has been checked by peds, and per pt they  say all is ok         Physical exam Vitals:   06/06/16 2201 06/07/16 0632 06/07/16 1730 06/08/16 0624  BP: 122/65 136/84 139/85 (!) 146/88  Pulse: 68 62 79 65  Resp: 15 18 18 18   Temp: 98.4 F (36.9 C) 97.7 F (36.5 C) 98.8 F (37.1 C) 98.1 F (36.7 C)  TempSrc: Oral Oral Oral Oral  SpO2:  100%    Weight:      Height:       General: alert, cooperative and no distress Lochia: appropriate Uterine Fundus: firm Incision: N/A DVT Evaluation: No evidence of DVT seen on physical exam. Negative Homan's sign. No cords or calf tenderness. 2+ BLE edema No clonus DTRs 2+  Labs: Recent  Labs       Lab Results  Component Value Date   WBC 12.6 (H) 06/06/2016   HGB 13.2 06/06/2016   HCT 37.7 06/06/2016   MCV 88.9 06/06/2016   PLT 186 06/06/2016     CMP Latest Ref Rng & Units 06/05/2016  Glucose 65 - 99 mg/dL 161(W)  BUN 6 - 20 mg/dL 9  Creatinine 9.60 - 4.54 mg/dL 0.98  Sodium 119 - 147 mmol/L 133(L)  Potassium 3.5 - 5.1 mmol/L 3.3(L)  Chloride 101 - 111 mmol/L 104  CO2 22 - 32 mmol/L 21(L)  Calcium 8.9 - 10.3 mg/dL 9.1  Total Protein 6.5 - 8.1 g/dL 6.5  Total Bilirubin 0.3 - 1.2 mg/dL 0.8  Alkaline Phos 38 - 126 U/L 193(H)  AST 15 - 41 U/L 26  ALT 14 - 54 U/L 17    Discharge instruction: per After Visit Summary and "Baby and Me Booklet".  After visit meds:    Medication List    TAKE these medications   amLODipine 10 MG tablet Commonly known as:  NORVASC Take 1 tablet (10 mg total) by mouth daily.  prenatal vitamin w/FE, FA 27-1 MG Tabs tablet Take one tablet by mouth daily      Diet: routine diet  Activity: Advance as tolerated. Pelvic rest for 6 weeks.   Outpatient follow up:1wk for bp check, then 4-6wks for pp visit Follow up Appt:Future Appointments Date Time Provider Department Center  07/17/2016 9:30 AM Cheral Marker, CNM [facility]   Follow up Visit:No Follow-up on file.  Postpartum contraception:  plans POPs  Newborn Data: Live born female  Birth Weight: 8 lb 14 oz (4025 g) APGAR: 8, 9  Baby Feeding: Breast Disposition:home with mother Start Norvasc 10mg  daily today, will hold d/c until bp recheck to make sure responding well Discussed pre-e s/s w/ pt, reasons to return

## 2016-06-11 ENCOUNTER — Other Ambulatory Visit: Payer: Self-pay | Admitting: Advanced Practice Midwife

## 2016-06-11 ENCOUNTER — Telehealth: Payer: Self-pay | Admitting: *Deleted

## 2016-06-11 MED ORDER — IBUPROFEN 600 MG PO TABS: 600.0000 mg | ORAL_TABLET | Freq: Four times a day (QID) | ORAL | Status: DC | PRN

## 2016-06-11 NOTE — Progress Notes (Unsigned)
Ibuprofen for pain.  Had GHTN, but BP should not be affected by short course per pharmacy

## 2016-06-11 NOTE — Telephone Encounter (Signed)
Pt states had a vaginal delivery 06/06/2016, c/o abdominal and lower back pain. Requesting medication for pain. Pt [facility] Please advise.

## 2016-06-11 NOTE — Telephone Encounter (Signed)
Pt informed Ibuprofen sent to pharmacy. Pt verbalized understanding.

## 2016-06-11 NOTE — Telephone Encounter (Signed)
Ibuprofen sent to pharmacy.  It can increase BP, but should be ok for short course per pharmacy.

## 2016-06-13 ENCOUNTER — Encounter: Payer: Self-pay | Admitting: *Deleted

## 2016-06-13 ENCOUNTER — Ambulatory Visit (INDEPENDENT_AMBULATORY_CARE_PROVIDER_SITE_OTHER): Payer: Medicaid Other | Admitting: Obstetrics & Gynecology

## 2016-06-13 VITALS — BP 150/90 | HR 78 | Ht 63.0 in | Wt 173.5 lb

## 2016-06-13 DIAGNOSIS — R030 Elevated blood-pressure reading, without diagnosis of hypertension: Secondary | ICD-10-CM

## 2016-06-13 DIAGNOSIS — Z0130 Encounter for examination of blood pressure without abnormal findings: Secondary | ICD-10-CM

## 2016-06-13 NOTE — Progress Notes (Signed)
Pt here for BP check. She delivered 06/06/16. BP today was 150/90. I reviewed everything with Dr. Despina HiddenEure and he advised everything sounded good and to keep taking Norvasc as prescribed. Pt has postpartum visit scheduled. Advised to keep that appt and let us know if anything changes. Pt voiced understanding. JSY

## 2016-06-16 ENCOUNTER — Telehealth: Payer: Self-pay | Admitting: *Deleted

## 2016-06-16 NOTE — Telephone Encounter (Signed)
Clydie BraunKaren, postpartum nurse from health dept called to report a normal BP. BP today was 130/90. Pt is on BP meds. Pt was seen Friday for a BP check. BP Friday was 150/90. BP better today. JSY

## 2016-06-19 ENCOUNTER — Telehealth: Payer: Self-pay | Admitting: *Deleted

## 2016-06-19 NOTE — Telephone Encounter (Signed)
Pt informed per Cathie BeamsFran Cresenzo-Dishmon, CNM can take the OTC Tylenol Cold and cough or similar while breastfeeding. Pt verbalized understanding.

## 2016-06-19 NOTE — Telephone Encounter (Signed)
Pt c/o runny nose, sore throat, ear ache, nonproductive cough, no fever. What is safe for her to take OTC while breastfeeding?

## 2016-06-19 NOTE — Telephone Encounter (Signed)
Tylenol (or similar) cold and cough

## 2016-07-17 ENCOUNTER — Ambulatory Visit: Payer: Medicaid Other | Admitting: Women's Health

## 2016-07-18 ENCOUNTER — Ambulatory Visit (INDEPENDENT_AMBULATORY_CARE_PROVIDER_SITE_OTHER): Payer: Medicaid Other | Admitting: Women's Health

## 2016-07-18 ENCOUNTER — Encounter: Payer: Self-pay | Admitting: Women's Health

## 2016-07-18 DIAGNOSIS — Z8759 Personal history of other complications of pregnancy, childbirth and the puerperium: Secondary | ICD-10-CM | POA: Insufficient documentation

## 2016-07-18 DIAGNOSIS — O165 Unspecified maternal hypertension, complicating the puerperium: Secondary | ICD-10-CM | POA: Insufficient documentation

## 2016-07-18 MED ORDER — NORETHINDRONE 0.35 MG PO TABS: ORAL_TABLET | ORAL | 11 refills | Status: DC

## 2016-07-18 MED ORDER — AMLODIPINE BESYLATE 5 MG PO TABS: 5.0000 mg | ORAL_TABLET | Freq: Every day | ORAL | 0 refills | Status: DC

## 2016-07-18 NOTE — Patient Instructions (Signed)
Stop taking norvasc (blood pressure pill) 2 days before your next visit

## 2016-07-18 NOTE — Progress Notes (Signed)
Subjective:    Cristina [name] is a 30 y.o. [phone] African American female who presents for a postpartum visit. She is 6 weeks postpartum following a spontaneous vaginal delivery at 40.0 gestational weeks after IOL for GHTN. Anesthesia: epidural. I have fully reviewed the prenatal and intrapartum course. There was a mild shoulder dystocia, baby weighed 8lb14oz. D/C'd home on norvasc 10mg  daily- stopped about 1.5wks ago. Postpartum course has been complicated by pp htn. Baby's course has been uncomplicated. Baby is feeding by bottle. Bleeding on period. Bowel function is normal. Bladder function is normal. Patient is not sexually active. Last sexual activity: prior to birth of baby. Contraception method is wants pills- w/ bp still slightly elevated discussed progestin only methods- wants pops at this time. Postpartum depression screening: negative. Score 0.  Last pap 2016 @ [facility] and was normal.  The following portions of the patient's history were reviewed and updated as appropriate: allergies, current medications, past medical history, past surgical history and problem list.  Review of Systems Pertinent items are noted in HPI.   Vitals:   07/18/16 0852  BP: 130/90  Pulse: 76  Weight: 169 lb (76.7 kg)   Patient's last menstrual period was 07/16/2016.  Objective:   General:  alert, cooperative and no distress   Breasts:  deferred, no complaints  Lungs: clear to auscultation bilaterally  Heart:  regular rate and rhythm  Abdomen: soft, nontender   Vulva: normal  Vagina: normal vagina, on period  Cervix:  closed  Corpus: Well-involuted  Adnexa:  Non-palpable  Rectal Exam: No hemorrhoids        Assessment:   Postpartum exam 6 wks s/p SVB after IOL for GHTN PP HTN Mild shoulder dystocia Bottlefeeding Depression screening Contraception counseling   Plan:  Rx norvasc 5mg  daily, stop taking 2d before next visit Contraception: rx micronor, understands has to take at exact same time  daily to be effective, set alarm to remind, if late for pill and has sex- use condoms Follow up in: 4 weeks for bp f/u or earlier if needed If bp normal off meds at f/u visit, will switch to regular coc's  Cristina [name], Cristina [name] CNM, [facility] 07/18/2016 9:06 AM

## 2016-07-24 ENCOUNTER — Telehealth: Payer: Self-pay | Admitting: Women's Health

## 2016-07-24 NOTE — Telephone Encounter (Signed)
Patient called stating she noticed she had some rectal bleeding this morning. She was just seen recently for her pp visit. Her vaginal tear has healed. She has stopped taking her stool softners, not drinking as much water or eating fruits and vegetables. Patient was encouraged restart taking stool softners, increase fiber and fluids. If she continued to have problems to call us back

## 2016-07-24 NOTE — Telephone Encounter (Signed)
Pt called stating that she would like a call back from a nurse because she has some question's. Please contact pt

## 2016-08-18 ENCOUNTER — Encounter: Payer: Self-pay | Admitting: Women's Health

## 2016-08-18 ENCOUNTER — Ambulatory Visit (INDEPENDENT_AMBULATORY_CARE_PROVIDER_SITE_OTHER): Payer: Medicaid Other | Admitting: Women's Health

## 2016-08-18 VITALS — BP 122/68 | HR 68 | Wt 174.0 lb

## 2016-08-18 DIAGNOSIS — Z30011 Encounter for initial prescription of contraceptive pills: Secondary | ICD-10-CM | POA: Diagnosis not present

## 2016-08-18 DIAGNOSIS — Z0130 Encounter for examination of blood pressure without abnormal findings: Secondary | ICD-10-CM

## 2016-08-18 DIAGNOSIS — O165 Unspecified maternal hypertension, complicating the puerperium: Secondary | ICD-10-CM

## 2016-08-18 MED ORDER — NORETHIN-ETH ESTRAD-FE BIPHAS 1 MG-10 MCG / 10 MCG PO TABS: 1.0000 | ORAL_TABLET | Freq: Every day | ORAL | 3 refills | Status: DC

## 2016-08-18 NOTE — Progress Notes (Signed)
   [facility]  Patient name: Cristina [name] MRN [id number]  Date of birth: 05/19/1986  CC & HPI:  Cristina [name] is a 30 y.o. [id number] African American female presenting today for bp f/u. She is ~10wks pp SVB after IOL for GHTN, was d/c'd from hospital on norvasc 10mg  which she stopped prior to her pp visit. BP was still slightly elevated, so was restarted on norvasc 5mg  daily and instructed to stop 2d prior to this visit, which she did. Was also rx'd micronor at that visit d/t bp w/ hopes of switching to coc's this visit. She is bottlefeeding. Smokes very rarely, no h/o HTN outside of pregnancy/pp, DVT/PE, CVA, MI, or migraines w/ aura. Having some constipation, restarted her stool softeners and is much better.  Patient's last menstrual period was 08/13/2016. The current method of family planning is Pharmacist, hospitalmicronor. Last pap 2016 @ [facility], was normal  Pertinent History Reviewed:  Medical & Surgical Hx:   Past medical, surgical, family, and social history reviewed in electronic medical record Medications: Reviewed & Updated - see associated section Allergies: Reviewed in electronic medical record  Objective Findings:  Vitals: BP 122/68 (BP Location: Right Arm, Patient Position: Sitting, Cuff Size: Normal)   Pulse 68   Wt 174 lb (78.9 kg)   LMP 08/13/2016   Breastfeeding? No   BMI 30.82 kg/m  Body mass index is 30.82 kg/m.  Physical Examination: General appearance - alert, well appearing, and in no distress  No results found for this or any previous visit (from the past 24 hour(s)).   Assessment & Plan:  A:   Resolved pp HTN  10wks pp  Bottlefeeding  P:  Stop micronor, rx LoLoestrin 3pk w/ 3RF  Return in about 3 months (around 11/18/2016) for F/U.  Marge DuncansBooker, Joshua Soulier Randall CNM, [facility] 08/18/2016 12:05 PM

## 2016-08-18 NOTE — Patient Instructions (Signed)
Oral Contraception Use Oral contraceptive pills (OCPs) are medicines taken to prevent pregnancy. OCPs work by preventing the ovaries from releasing eggs. The hormones in OCPs also cause the cervical mucus to thicken, preventing the sperm from entering the uterus. The hormones also cause the uterine lining to become thin, not allowing a fertilized egg to attach to the inside of the uterus. OCPs are highly effective when taken exactly as prescribed. However, OCPs do not prevent sexually transmitted diseases (STDs). Safe sex practices, such as using condoms along with an OCP, can help prevent STDs. Before taking OCPs, you may have a physical exam and Pap test. Your health care provider may also order blood tests if necessary. Your health care provider will make sure you are a good candidate for oral contraception. Discuss with your health care provider the possible side effects of the OCP you may be prescribed. When starting an OCP, it can take 2 to 3 months for the body to adjust to the changes in hormone levels in your body. How to take oral contraceptive pills Your health care provider may advise you on how to start taking the first cycle of OCPs. Otherwise, you can:  Start on day 1 of your menstrual period. You will not need any backup contraceptive protection with this start time.  Start on the first Sunday after your menstrual period or the day you get your prescription. In these cases, you will need to use backup contraceptive protection for the first week.  Start the pill at any time of your cycle. If you take the pill within 5 days of the start of your period, you are protected against pregnancy right away. In this case, you will not need a backup form of birth control. If you start at any other time of your menstrual cycle, you will need to use another form of birth control for 7 days. If your OCP is the type called a minipill, it will protect you from pregnancy after taking it for 2 days (48  hours).  After you have started taking OCPs:  If you forget to take 1 pill, take it as soon as you remember. Take the next pill at the regular time.  If you miss 2 or more pills, call your health care provider because different pills have different instructions for missed doses. Use backup birth control until your next menstrual period starts.  If you use a 28-day pack that contains inactive pills and you miss 1 of the last 7 pills (pills with no hormones), it will not matter. Throw away the rest of the non-hormone pills and start a new pill pack.  No matter which day you start the OCP, you will always start a new pack on that same day of the week. Have an extra pack of OCPs and a backup contraceptive method available in case you miss some pills or lose your OCP pack. Follow these instructions at home:  Do not smoke.  Always use a condom to protect against STDs. OCPs do not protect against STDs.  Use a calendar to mark your menstrual period days.  Read the information and directions that came with your OCP. Talk to your health care provider if you have questions. Contact a health care provider if:  You develop nausea and vomiting.  You have abnormal vaginal discharge or bleeding.  You develop a rash.  You miss your menstrual period.  You are losing your hair.  You need treatment for mood swings or depression.  You   get dizzy when taking the OCP.  You develop acne from taking the OCP.  You become pregnant. Get help right away if:  You develop chest pain.  You develop shortness of breath.  You have an uncontrolled or severe headache.  You develop numbness or slurred speech.  You develop visual problems.  You develop pain, redness, and swelling in the legs. This information is not intended to replace advice given to you by your health care provider. Make sure you discuss any questions you have with your health care provider. Document Released: 08/28/2011 Document  Revised: 02/14/2016 Document Reviewed: 02/27/2013 Elsevier Interactive Patient Education  2017 Elsevier Inc.  

## 2016-11-20 ENCOUNTER — Ambulatory Visit: Payer: Medicaid Other | Admitting: Women's Health

## 2016-12-04 ENCOUNTER — Ambulatory Visit: Payer: Medicaid Other | Admitting: Women's Health

## 2017-03-31 ENCOUNTER — Encounter (HOSPITAL_COMMUNITY): Payer: Self-pay

## 2017-03-31 ENCOUNTER — Emergency Department (HOSPITAL_COMMUNITY)
Admission: EM | Admit: 2017-03-31 | Discharge: 2017-03-31 | Disposition: A | Discharge status: Home / Self Care | Payer: No Typology Code available for payment source | Attending: Emergency Medicine | Admitting: Emergency Medicine

## 2017-03-31 ENCOUNTER — Emergency Department (HOSPITAL_COMMUNITY): Payer: No Typology Code available for payment source

## 2017-03-31 DIAGNOSIS — Y92410 Unspecified street and highway as the place of occurrence of the external cause: Secondary | ICD-10-CM | POA: Insufficient documentation

## 2017-03-31 DIAGNOSIS — M25512 Pain in left shoulder: Secondary | ICD-10-CM | POA: Diagnosis not present

## 2017-03-31 DIAGNOSIS — Z87891 Personal history of nicotine dependence: Secondary | ICD-10-CM | POA: Insufficient documentation

## 2017-03-31 DIAGNOSIS — S161XXA Strain of muscle, fascia and tendon at neck level, initial encounter: Secondary | ICD-10-CM | POA: Diagnosis not present

## 2017-03-31 DIAGNOSIS — Y939 Activity, unspecified: Secondary | ICD-10-CM | POA: Insufficient documentation

## 2017-03-31 DIAGNOSIS — S1090XA Unspecified superficial injury of unspecified part of neck, initial encounter: Secondary | ICD-10-CM | POA: Diagnosis present

## 2017-03-31 DIAGNOSIS — M25561 Pain in right knee: Secondary | ICD-10-CM | POA: Insufficient documentation

## 2017-03-31 DIAGNOSIS — Y999 Unspecified external cause status: Secondary | ICD-10-CM | POA: Insufficient documentation

## 2017-03-31 HISTORY — DX: Essential (primary) hypertension: I10

## 2017-03-31 LAB — URINALYSIS, ROUTINE W REFLEX MICROSCOPIC
Bilirubin Urine: NEGATIVE
GLUCOSE, UA: NEGATIVE mg/dL
Hgb urine dipstick: NEGATIVE
KETONES UR: NEGATIVE mg/dL
Leukocytes, UA: NEGATIVE
Nitrite: NEGATIVE
PH: 6 (ref 5.0–8.0)
Protein, ur: 30 mg/dL — AB
SPECIFIC GRAVITY, URINE: 1.021 (ref 1.005–1.030)

## 2017-03-31 LAB — POC URINE PREG, ED: Preg Test, Ur: NEGATIVE

## 2017-03-31 MED ORDER — IBUPROFEN 600 MG PO TABS: 600.0000 mg | ORAL_TABLET | Freq: Four times a day (QID) | ORAL | 0 refills | Status: AC | PRN

## 2017-03-31 MED ORDER — CYCLOBENZAPRINE HCL 10 MG PO TABS: 10.0000 mg | ORAL_TABLET | Freq: Three times a day (TID) | ORAL | 0 refills | Status: AC | PRN

## 2017-03-31 NOTE — Discharge Instructions (Signed)
Apply ice packs on/off to your hip and neck.  Follow-up with your primary provider for recheck
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
## 2017-04-01 ENCOUNTER — Emergency Department (HOSPITAL_COMMUNITY)
Admission: EM | Admit: 2017-04-01 | Discharge: 2017-04-01 | Disposition: A | Discharge status: Home / Self Care | Payer: No Typology Code available for payment source | Attending: Emergency Medicine | Admitting: Emergency Medicine

## 2017-04-01 ENCOUNTER — Encounter (HOSPITAL_COMMUNITY): Payer: Self-pay | Admitting: Emergency Medicine

## 2017-04-01 DIAGNOSIS — Z87891 Personal history of nicotine dependence: Secondary | ICD-10-CM | POA: Insufficient documentation

## 2017-04-01 DIAGNOSIS — T148XXA Other injury of unspecified body region, initial encounter: Secondary | ICD-10-CM

## 2017-04-01 DIAGNOSIS — M791 Myalgia: Secondary | ICD-10-CM | POA: Diagnosis not present

## 2017-04-01 DIAGNOSIS — I10 Essential (primary) hypertension: Secondary | ICD-10-CM | POA: Insufficient documentation
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
## 2017-04-01 NOTE — Discharge Instructions (Signed)
As discussed, you do not need to wear a neck brace as these can actually cause worsened symptoms including weakening of your neck muscles.  Continue using ice packs and other instructions as outlined yesterday.
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
## 2020-11-22 DIAGNOSIS — Z3042 Encounter for surveillance of injectable contraceptive: Secondary | ICD-10-CM | POA: Diagnosis not present

## 2020-12-27 DIAGNOSIS — N926 Irregular menstruation, unspecified: Secondary | ICD-10-CM | POA: Diagnosis not present

## 2020-12-27 DIAGNOSIS — Z6826 Body mass index (BMI) 26.0-26.9, adult: Secondary | ICD-10-CM | POA: Diagnosis not present

## 2020-12-27 DIAGNOSIS — Z0000 Encounter for general adult medical examination without abnormal findings: Secondary | ICD-10-CM | POA: Diagnosis not present

## 2020-12-27 DIAGNOSIS — Z01419 Encounter for gynecological examination (general) (routine) without abnormal findings: Secondary | ICD-10-CM | POA: Diagnosis not present

## 2022-08-27 DIAGNOSIS — Z3201 Encounter for pregnancy test, result positive: Secondary | ICD-10-CM | POA: Diagnosis not present

## 2022-08-27 DIAGNOSIS — N912 Amenorrhea, unspecified: Secondary | ICD-10-CM | POA: Diagnosis not present

## 2022-08-27 DIAGNOSIS — O3680X0 Pregnancy with inconclusive fetal viability, not applicable or unspecified: Secondary | ICD-10-CM | POA: Diagnosis not present

## 2022-09-01 DIAGNOSIS — O3680X0 Pregnancy with inconclusive fetal viability, not applicable or unspecified: Secondary | ICD-10-CM | POA: Diagnosis not present

## 2022-09-05 DIAGNOSIS — O3680X0 Pregnancy with inconclusive fetal viability, not applicable or unspecified: Secondary | ICD-10-CM | POA: Diagnosis not present

## 2022-09-05 DIAGNOSIS — O021 Missed abortion: Secondary | ICD-10-CM | POA: Diagnosis not present

## 2022-09-12 DIAGNOSIS — O021 Missed abortion: Secondary | ICD-10-CM | POA: Diagnosis not present

## 2022-09-12 DIAGNOSIS — N912 Amenorrhea, unspecified: Secondary | ICD-10-CM | POA: Diagnosis not present

## 2022-09-19 DIAGNOSIS — O021 Missed abortion: Secondary | ICD-10-CM | POA: Diagnosis not present

## 2022-09-26 DIAGNOSIS — O021 Missed abortion: Secondary | ICD-10-CM | POA: Diagnosis not present

## 2022-10-06 ENCOUNTER — Telehealth: Payer: Self-pay

## 2022-10-06 NOTE — Telephone Encounter (Signed)
Mychart msg sent. AS, CMA 

## 2022-11-04 DIAGNOSIS — Z113 Encounter for screening for infections with a predominantly sexual mode of transmission: Secondary | ICD-10-CM | POA: Diagnosis not present

## 2022-11-04 DIAGNOSIS — Z0000 Encounter for general adult medical examination without abnormal findings: Secondary | ICD-10-CM | POA: Diagnosis not present

## 2022-11-04 DIAGNOSIS — Z1329 Encounter for screening for other suspected endocrine disorder: Secondary | ICD-10-CM | POA: Diagnosis not present

## 2022-11-04 DIAGNOSIS — Z13220 Encounter for screening for lipoid disorders: Secondary | ICD-10-CM | POA: Diagnosis not present

## 2022-11-04 DIAGNOSIS — Z130 Encounter for screening for diseases of the blood and blood-forming organs and certain disorders involving the immune mechanism: Secondary | ICD-10-CM | POA: Diagnosis not present

## 2022-11-04 DIAGNOSIS — Z131 Encounter for screening for diabetes mellitus: Secondary | ICD-10-CM | POA: Diagnosis not present

## 2022-11-04 DIAGNOSIS — Z1321 Encounter for screening for nutritional disorder: Secondary | ICD-10-CM | POA: Diagnosis not present

## 2023-03-23 DIAGNOSIS — Z3A09 9 weeks gestation of pregnancy: Secondary | ICD-10-CM | POA: Diagnosis not present

## 2023-03-23 DIAGNOSIS — O09521 Supervision of elderly multigravida, first trimester: Secondary | ICD-10-CM | POA: Diagnosis not present

## 2023-03-23 DIAGNOSIS — O208 Other hemorrhage in early pregnancy: Secondary | ICD-10-CM | POA: Diagnosis not present

## 2023-03-23 DIAGNOSIS — Z3201 Encounter for pregnancy test, result positive: Secondary | ICD-10-CM | POA: Diagnosis not present

## 2023-03-23 DIAGNOSIS — N912 Amenorrhea, unspecified: Secondary | ICD-10-CM | POA: Diagnosis not present

## 2023-03-23 DIAGNOSIS — O09299 Supervision of pregnancy with other poor reproductive or obstetric history, unspecified trimester: Secondary | ICD-10-CM | POA: Diagnosis not present

## 2023-03-23 DIAGNOSIS — O468X1 Other antepartum hemorrhage, first trimester: Secondary | ICD-10-CM | POA: Diagnosis not present

## 2023-03-23 DIAGNOSIS — O3680X0 Pregnancy with inconclusive fetal viability, not applicable or unspecified: Secondary | ICD-10-CM | POA: Diagnosis not present

## 2023-03-23 DIAGNOSIS — Z3689 Encounter for other specified antenatal screening: Secondary | ICD-10-CM | POA: Diagnosis not present

## 2023-04-02 DIAGNOSIS — Z3689 Encounter for other specified antenatal screening: Secondary | ICD-10-CM | POA: Diagnosis not present

## 2023-04-02 DIAGNOSIS — Z3A11 11 weeks gestation of pregnancy: Secondary | ICD-10-CM | POA: Diagnosis not present

## 2023-04-02 DIAGNOSIS — O09521 Supervision of elderly multigravida, first trimester: Secondary | ICD-10-CM | POA: Diagnosis not present

## 2023-04-02 DIAGNOSIS — O208 Other hemorrhage in early pregnancy: Secondary | ICD-10-CM | POA: Diagnosis not present

## 2023-05-28 DIAGNOSIS — O09522 Supervision of elderly multigravida, second trimester: Secondary | ICD-10-CM | POA: Diagnosis not present

## 2023-05-28 DIAGNOSIS — Z3A19 19 weeks gestation of pregnancy: Secondary | ICD-10-CM | POA: Diagnosis not present

## 2023-05-28 DIAGNOSIS — O4402 Placenta previa specified as without hemorrhage, second trimester: Secondary | ICD-10-CM | POA: Diagnosis not present

## 2023-05-28 DIAGNOSIS — O3503X0 Maternal care for (suspected) central nervous system malformation or damage in fetus, choroid plexus cysts, not applicable or unspecified: Secondary | ICD-10-CM | POA: Diagnosis not present

## 2023-05-28 DIAGNOSIS — Z3689 Encounter for other specified antenatal screening: Secondary | ICD-10-CM | POA: Diagnosis not present

## 2023-06-04 LAB — PANORAMA PRENATAL TEST FULL PANEL:PANORAMA TEST PLUS 5 ADDITIONAL MICRODELETIONS: FETAL FRACTION: 8.2

## 2023-07-23 DIAGNOSIS — Z3689 Encounter for other specified antenatal screening: Secondary | ICD-10-CM | POA: Diagnosis not present

## 2023-07-28 DIAGNOSIS — O99810 Abnormal glucose complicating pregnancy: Secondary | ICD-10-CM | POA: Diagnosis not present

## 2023-07-31 DIAGNOSIS — O4413 Placenta previa with hemorrhage, third trimester: Secondary | ICD-10-CM | POA: Diagnosis not present

## 2023-07-31 DIAGNOSIS — O4400 Placenta previa specified as without hemorrhage, unspecified trimester: Secondary | ICD-10-CM | POA: Diagnosis not present

## 2023-07-31 DIAGNOSIS — O26853 Spotting complicating pregnancy, third trimester: Secondary | ICD-10-CM | POA: Diagnosis not present

## 2023-07-31 DIAGNOSIS — O09523 Supervision of elderly multigravida, third trimester: Secondary | ICD-10-CM | POA: Diagnosis not present

## 2023-07-31 DIAGNOSIS — Z9989 Dependence on other enabling machines and devices: Secondary | ICD-10-CM | POA: Diagnosis not present

## 2023-07-31 DIAGNOSIS — Z3A28 28 weeks gestation of pregnancy: Secondary | ICD-10-CM | POA: Diagnosis not present

## 2023-07-31 DIAGNOSIS — O4443 Low lying placenta NOS or without hemorrhage, third trimester: Secondary | ICD-10-CM | POA: Diagnosis not present

## 2023-07-31 DIAGNOSIS — N898 Other specified noninflammatory disorders of vagina: Secondary | ICD-10-CM | POA: Diagnosis not present

## 2023-07-31 DIAGNOSIS — O23593 Infection of other part of genital tract in pregnancy, third trimester: Secondary | ICD-10-CM | POA: Diagnosis not present

## 2023-08-01 DIAGNOSIS — O4453 Low lying placenta with hemorrhage, third trimester: Secondary | ICD-10-CM | POA: Diagnosis not present

## 2023-08-01 DIAGNOSIS — O321XX0 Maternal care for breech presentation, not applicable or unspecified: Secondary | ICD-10-CM | POA: Diagnosis not present

## 2023-08-01 DIAGNOSIS — Z3A28 28 weeks gestation of pregnancy: Secondary | ICD-10-CM | POA: Diagnosis not present

## 2023-08-01 DIAGNOSIS — O23593 Infection of other part of genital tract in pregnancy, third trimester: Secondary | ICD-10-CM | POA: Diagnosis not present

## 2023-08-01 DIAGNOSIS — B9689 Other specified bacterial agents as the cause of diseases classified elsewhere: Secondary | ICD-10-CM | POA: Diagnosis not present

## 2023-09-01 DIAGNOSIS — Z3A32 32 weeks gestation of pregnancy: Secondary | ICD-10-CM | POA: Diagnosis not present

## 2023-09-01 DIAGNOSIS — O4453 Low lying placenta with hemorrhage, third trimester: Secondary | ICD-10-CM | POA: Diagnosis not present

## 2023-09-28 DIAGNOSIS — Z3689 Encounter for other specified antenatal screening: Secondary | ICD-10-CM | POA: Diagnosis not present

## 2023-09-28 DIAGNOSIS — Z3A36 36 weeks gestation of pregnancy: Secondary | ICD-10-CM | POA: Diagnosis not present

## 2023-10-12 DIAGNOSIS — R030 Elevated blood-pressure reading, without diagnosis of hypertension: Secondary | ICD-10-CM | POA: Diagnosis not present

## 2023-10-12 DIAGNOSIS — O99893 Other specified diseases and conditions complicating puerperium: Secondary | ICD-10-CM | POA: Diagnosis not present

## 2023-10-12 DIAGNOSIS — Z3A38 38 weeks gestation of pregnancy: Secondary | ICD-10-CM | POA: Diagnosis not present

## 2023-10-12 DIAGNOSIS — O4202 Full-term premature rupture of membranes, onset of labor within 24 hours of rupture: Secondary | ICD-10-CM | POA: Diagnosis not present

## 2023-10-16 DIAGNOSIS — Z8759 Personal history of other complications of pregnancy, childbirth and the puerperium: Secondary | ICD-10-CM | POA: Diagnosis not present

## 2023-10-16 DIAGNOSIS — Z8679 Personal history of other diseases of the circulatory system: Secondary | ICD-10-CM | POA: Diagnosis not present

## 2023-11-02 DIAGNOSIS — O09523 Supervision of elderly multigravida, third trimester: Secondary | ICD-10-CM | POA: Diagnosis not present

## 2024-07-25 DIAGNOSIS — K029 Dental caries, unspecified: Secondary | ICD-10-CM | POA: Diagnosis not present

## 2024-09-14 ENCOUNTER — Ambulatory Visit
Admission: EM | Admit: 2024-09-14 | Discharge: 2024-09-14 | Disposition: A | Discharge status: Home / Self Care | Attending: Nurse Practitioner | Admitting: Nurse Practitioner

## 2024-09-14 ENCOUNTER — Ambulatory Visit (INDEPENDENT_AMBULATORY_CARE_PROVIDER_SITE_OTHER)

## 2024-09-14 DIAGNOSIS — R051 Acute cough: Secondary | ICD-10-CM

## 2024-09-14 DIAGNOSIS — J069 Acute upper respiratory infection, unspecified: Secondary | ICD-10-CM | POA: Diagnosis not present

## 2024-09-14 DIAGNOSIS — R112 Nausea with vomiting, unspecified: Secondary | ICD-10-CM | POA: Diagnosis not present

## 2024-09-14 LAB — POC COVID19/FLU A&B COMBO
Covid Antigen, POC: NEGATIVE
Influenza A Antigen, POC: NEGATIVE
Influenza B Antigen, POC: NEGATIVE

## 2024-09-14 MED ORDER — ONDANSETRON 4 MG PO TBDP
4.0000 mg | ORAL_TABLET | Freq: Once | ORAL | Status: AC
  Administered 2024-09-14: 4 mg via ORAL

## 2024-09-14 MED ORDER — ONDANSETRON 4 MG PO TBDP: 4.0000 mg | ORAL_TABLET | Freq: Three times a day (TID) | ORAL | 0 refills | Status: DC | PRN

## 2024-09-14 MED ORDER — BENZONATATE 100 MG PO CAPS: 100.0000 mg | ORAL_CAPSULE | Freq: Three times a day (TID) | ORAL | 0 refills | Status: AC | PRN

## 2024-09-14 NOTE — ED Triage Notes (Signed)
 Pt reports she has a cough, runny nose, sore throat, sweats, chills, abdominal pain, and n/v x 3 days

## 2024-09-14 NOTE — Discharge Instructions (Signed)
 You have a viral upper respiratory infection.  COVID-19 and influenza testing is negative today.  Chest x-ray is negative for pneumonia today.  Symptoms should improve over the next week to 10 days.  If you develop chest pain or shortness of breath, go to the emergency room.  Some things that can make you feel better are: - Increased rest - Increasing fluid with water/sugar free electrolytes - Acetaminophen  and ibuprofen  as needed for fever/pain - Salt water gargling, chloraseptic spray and throat lozenges - OTC guaifenesin (Mucinex) 600 mg twice daily - Saline sinus flushes or a neti pot - Humidifying the air -Tessalon  Perles every 8 hours as needed for dry cough   Zofran  every 8 hours as needed for nausea/vomiting

## 2024-09-14 NOTE — ED Provider Notes (Signed)
 " [facility]    CSN: [id number] Arrival date & time: 09/14/24  0808      History   Chief Complaint No chief complaint on file.   HPI Cristina [name] is a 38 y.o. female.   Patient presents today with 3-day history of low-grade fevers, Tmax 99 F, chills, sweats, body aches, congested cough, runny and stuffy nose, sore throat, nausea, 3 episodes of vomiting today so far, loose yellow stool, decreased appetite, and fatigue.  No shortness of breath or chest pain, headache, or ear pain.  No blood in vomit or stool.  She has been able to keep some water down overnight.  Was able to eat some soup yesterday and keep it down.  Reports her children been sick with similar symptoms.  Has been taking green teas with honey with minimal improvement.    Past Medical History:  Diagnosis Date   Ectopic pregnancy    Hypertension    during pregnancy    Patient Active Problem List   Diagnosis Date Noted   History of gestational hypertension 07/18/2016   Postpartum hypertension 07/18/2016   Shoulder dystocia during labor and delivery 07/18/2016    Past Surgical History:  Procedure Laterality Date   LAPAROSCOPIC UNILATERAL SALPINGECTOMY Right 01/01/2015   Procedure: LAPAROSCOPIC REMOVAL OF RIGHT FALLOPIAN TUBE AND ECTOPIC PREGNANCY ;  Surgeon: Vonn VEAR Inch, MD;  Location: [facility];  Service: Gynecology;  Laterality: Right;    OB History     Gravida  4   Para  2   Term  2   Preterm      AB  2   Living  2      SAB      IAB  1   Ectopic  1   Multiple  0   Live Births  2            Home Medications    Prior to Admission medications  Medication Sig Start Date End Date Taking? Authorizing Provider  benzonatate  (TESSALON ) 100 MG capsule Take 1 capsule (100 mg total) by mouth 3 (three) times daily as needed for cough. Do not take with alcohol or while operating or driving heavy machinery 87/75/74  Yes Chandra Raisin A, NP  ondansetron  (ZOFRAN -ODT) 4 MG  disintegrating tablet Take 1 tablet (4 mg total) by mouth every 8 (eight) hours as needed. 09/14/24  Yes Chandra Raisin LABOR, NP  cyclobenzaprine  (FLEXERIL ) 10 MG tablet Take 1 tablet (10 mg total) by mouth 3 (three) times daily as needed. 03/31/17   Triplett, Tammy, PA-C  ibuprofen  (ADVIL ,MOTRIN ) 600 MG tablet Take 1 tablet (600 mg total) by mouth every 6 (six) hours as needed. 03/31/17   Herlinda Milling, PA-C    Family History Family History  Problem Relation Age of Onset   Diabetes Mother    Cancer Maternal Grandmother        pancreatic   Cancer Maternal Aunt        breast   Diabetes Maternal Aunt        2nd aunt    Social History Social History[1]   Allergies   Patient has no known allergies.   Review of Systems Review of Systems Per HPI  Physical Exam Triage Vital Signs ED Triage Vitals  Encounter Vitals Group     BP 09/14/24 0930 (!) 163/84     Girls Systolic BP Percentile --      Girls Diastolic BP Percentile --      Boys Systolic BP  Percentile --      Boys Diastolic BP Percentile --      Pulse Rate 09/14/24 0930 93     Resp 09/14/24 0930 18     Temp 09/14/24 0930 99 F (37.2 C)     Temp Source 09/14/24 0930 Oral     SpO2 09/14/24 0930 98 %     Weight --      Height --      Head Circumference --      Peak Flow --      Pain Score 09/14/24 0929 4     Pain Loc --      Pain Education --      Exclude from Growth Chart --    No data found.  Updated Vital Signs BP 117/83 (BP Location: Right Arm)   Pulse 93   Temp 99 F (37.2 C) (Oral)   Resp 18   LMP 08/26/2024 (Approximate)   SpO2 98%   Visual Acuity Right Eye Distance:   Left Eye Distance:   Bilateral Distance:    Right Eye Near:   Left Eye Near:    Bilateral Near:     Physical Exam Vitals and nursing note reviewed.  Constitutional:      General: She is not in acute distress.    Appearance: Normal appearance. She is not ill-appearing or toxic-appearing.  HENT:     Head: Normocephalic and  atraumatic.     Right Ear: Tympanic membrane, ear canal and external ear normal.     Left Ear: Tympanic membrane, ear canal and external ear normal.     Nose: Congestion present. No rhinorrhea.     Mouth/Throat:     Mouth: Mucous membranes are moist.     Pharynx: Oropharynx is clear. Postnasal drip present. No oropharyngeal exudate or posterior oropharyngeal erythema.  Eyes:     General: No scleral icterus.    Extraocular Movements: Extraocular movements intact.  Cardiovascular:     Rate and Rhythm: Normal rate and regular rhythm.  Pulmonary:     Effort: Pulmonary effort is normal. No respiratory distress.     Breath sounds: Normal breath sounds. Transmitted upper airway sounds present. No wheezing, rhonchi or rales.  Abdominal:     General: Abdomen is flat. Bowel sounds are normal. There is no distension.     Palpations: Abdomen is soft.     Tenderness: There is no abdominal tenderness. There is no right CVA tenderness, left CVA tenderness, guarding or rebound.  Musculoskeletal:     Cervical back: Normal range of motion and neck supple.  Lymphadenopathy:     Cervical: No cervical adenopathy.  Skin:    General: Skin is warm and dry.     Coloration: Skin is not jaundiced or pale.     Findings: No erythema or rash.  Neurological:     Mental Status: She is alert and oriented to person, place, and time.  Psychiatric:        Behavior: Behavior is cooperative.      UC Treatments / Results  Labs (all labs ordered are listed, but only abnormal results are displayed) Labs Reviewed  POC COVID19/FLU A&B COMBO - Normal    EKG   Radiology DG Chest 2 View Result Date: 09/14/2024 CLINICAL DATA:  acute cough EXAM: CHEST - 2 VIEW COMPARISON:  None Available. FINDINGS: The cardiomediastinal silhouette is normal in contour. No pleural effusion. No pneumothorax. No acute pleuroparenchymal abnormality. Visualized abdomen is unremarkable. No acute osseous abnormality noted. IMPRESSION: No  acute cardiopulmonary abnormality. Electronically Signed   By: Corean Salter M.D.   On: 09/14/2024 10:22    Procedures Procedures (including critical care time)  Medications Ordered in UC Medications  ondansetron  (ZOFRAN -ODT) disintegrating tablet 4 mg (4 mg Oral Given 09/14/24 1014)    Initial Impression / Assessment and Plan / UC Course  I have reviewed the triage vital signs and the nursing notes.  Pertinent labs & imaging results that were available during my care of the patient were reviewed by me and considered in my medical decision making (see chart for details).   Patient is a 38 year old femalre presenting today with upper respiratory infection symptoms and abdominal pain, nausea/vomiting.  Vital signs are stable and examination is reassuring today.  Given transmitted upper airway sounds, chest x-ray obtained and negative for acute cardiopulmonary disease.  COVID-19, influenza testing is negative.  Zofran  was given with improvement in nausea and patient able to tolerate fluids at home.  Supportive care discussed, start Zofran  at home as needed for nausea/vomiting, cough suppressant medication.  Also recommended guaifenesin.  ER and return precautions discussed.  The patient was given the opportunity to ask questions.  All questions answered to their satisfaction.  The patient is in agreement to this plan.   Final Clinical Impressions(s) / UC Diagnoses   Final diagnoses:  Acute cough  Viral URI with cough  Nausea and vomiting, unspecified vomiting type     Discharge Instructions      You have a viral upper respiratory infection.  COVID-19 and influenza testing is negative today.  Chest x-ray is negative for pneumonia today.  Symptoms should improve over the next week to 10 days.  If you develop chest pain or shortness of breath, go to the emergency room.  Some things that can make you feel better are: - Increased rest - Increasing fluid with water/sugar free  electrolytes - Acetaminophen  and ibuprofen  as needed for fever/pain - Salt water gargling, chloraseptic spray and throat lozenges - OTC guaifenesin (Mucinex) 600 mg twice daily - Saline sinus flushes or a neti pot - Humidifying the air -Tessalon  Perles every 8 hours as needed for dry cough   Zofran  every 8 hours as needed for nausea/vomiting     ED Prescriptions     Medication Sig Dispense Auth. Provider   ondansetron  (ZOFRAN -ODT) 4 MG disintegrating tablet Take 1 tablet (4 mg total) by mouth every 8 (eight) hours as needed. 20 tablet Chandra Raisin A, NP   benzonatate  (TESSALON ) 100 MG capsule Take 1 capsule (100 mg total) by mouth 3 (three) times daily as needed for cough. Do not take with alcohol or while operating or driving heavy machinery 21 capsule Chandra Raisin LABOR, NP      PDMP not reviewed this encounter.     [1]  Social History Tobacco Use   Smoking status: Former    Current packs/day: 0.00    Types: Cigarettes    Quit date: 12/13/2014    Years since quitting: 9.7   Smokeless tobacco: Never  Substance Use Topics   Alcohol use: No    Comment: beer before pregnancy   Drug use: No     Chandra Raisin LABOR, NP 09/14/24 1039  "

## 2024-09-26 ENCOUNTER — Ambulatory Visit
Admission: EM | Admit: 2024-09-26 | Discharge: 2024-09-26 | Disposition: A | Discharge status: Home / Self Care | Attending: Family Medicine | Admitting: Family Medicine

## 2024-09-26 DIAGNOSIS — J069 Acute upper respiratory infection, unspecified: Secondary | ICD-10-CM | POA: Diagnosis not present

## 2024-09-26 DIAGNOSIS — R509 Fever, unspecified: Secondary | ICD-10-CM | POA: Diagnosis not present

## 2024-09-26 LAB — POC SOFIA SARS ANTIGEN FIA: SARS Coronavirus 2 Ag: NEGATIVE

## 2024-09-26 LAB — POCT INFLUENZA A/B
Influenza A, POC: NEGATIVE
Influenza B, POC: NEGATIVE

## 2024-09-26 MED ORDER — GUAIFENESIN ER 600 MG PO TB12: 600.0000 mg | ORAL_TABLET | Freq: Two times a day (BID) | ORAL | 0 refills | Status: AC

## 2024-09-26 MED ORDER — PREDNISONE 20 MG PO TABS: 40.0000 mg | ORAL_TABLET | Freq: Every day | ORAL | 0 refills | Status: AC

## 2024-09-26 MED ORDER — PROMETHAZINE-DM 6.25-15 MG/5ML PO SYRP: 5.0000 mL | ORAL_SOLUTION | Freq: Four times a day (QID) | ORAL | 0 refills | Status: AC | PRN

## 2024-09-26 MED ORDER — AZELASTINE HCL 0.1 % NA SOLN: 1.0000 | Freq: Two times a day (BID) | NASAL | 0 refills | Status: AC

## 2024-09-26 NOTE — ED Provider Notes (Signed)
 " [facility]    CSN: [id number] Arrival date & time: 09/26/24  9165      History   Chief Complaint No chief complaint on file.   HPI Cristina [name] is a 39 y.o. female.   Patient presenting today with 5-day history of congestion, cough, fever, headache, fatigue.  Denies chest pain, shortness of breath, abdominal pain, vomiting, diarrhea.  So far trying fluids, teas, over-the-counter cough medicines with minimal relief.  Numerous sick contacts in the home.    Past Medical History:  Diagnosis Date   Ectopic pregnancy    Hypertension    during pregnancy    Patient Active Problem List   Diagnosis Date Noted   History of gestational hypertension 07/18/2016   Postpartum hypertension 07/18/2016   Shoulder dystocia during labor and delivery 07/18/2016    Past Surgical History:  Procedure Laterality Date   LAPAROSCOPIC UNILATERAL SALPINGECTOMY Right 01/01/2015   Procedure: LAPAROSCOPIC REMOVAL OF RIGHT FALLOPIAN TUBE AND ECTOPIC PREGNANCY ;  Surgeon: Vonn VEAR Inch, MD;  Location: [facility];  Service: Gynecology;  Laterality: Right;    OB History     Gravida  4   Para  2   Term  2   Preterm      AB  2   Living  2      SAB      IAB  1   Ectopic  1   Multiple  0   Live Births  2            Home Medications    Prior to Admission medications  Medication Sig Start Date End Date Taking? Authorizing Provider  azelastine  (ASTELIN ) 0.1 % nasal spray Place 1 spray into both nostrils 2 (two) times daily. Use in each nostril as directed 09/26/24  Yes Stuart Vernell Norris, PA-C  guaiFENesin  (MUCINEX ) 600 MG 12 hr tablet Take 1 tablet (600 mg total) by mouth 2 (two) times daily. 09/26/24  Yes Stuart Vernell Norris, PA-C  predniSONE  (DELTASONE ) 20 MG tablet Take 2 tablets (40 mg total) by mouth daily with breakfast. 09/26/24  Yes Stuart Vernell Norris, PA-C  promethazine -dextromethorphan (PROMETHAZINE -DM) 6.25-15 MG/5ML syrup Take 5 mLs by mouth 4 (four)  times daily as needed. 09/26/24  Yes Stuart Vernell Norris, PA-C  benzonatate  (TESSALON ) 100 MG capsule Take 1 capsule (100 mg total) by mouth 3 (three) times daily as needed for cough. Do not take with alcohol or while operating or driving heavy machinery 87/75/74   Chandra Harlene LABOR, NP  cyclobenzaprine  (FLEXERIL ) 10 MG tablet Take 1 tablet (10 mg total) by mouth 3 (three) times daily as needed. 03/31/17   Triplett, Tammy, PA-C  ibuprofen  (ADVIL ,MOTRIN ) 600 MG tablet Take 1 tablet (600 mg total) by mouth every 6 (six) hours as needed. 03/31/17   Herlinda Milling, PA-C    Family History Family History  Problem Relation Age of Onset   Diabetes Mother    Cancer Maternal Grandmother        pancreatic   Cancer Maternal Aunt        breast   Diabetes Maternal Aunt        2nd aunt    Social History Social History[1]   Allergies   Patient has no known allergies.   Review of Systems Review of Systems PER HPI  Physical Exam Triage Vital Signs ED Triage Vitals  Encounter Vitals Group     BP 09/26/24 0851 127/75     Girls Systolic BP Percentile --  Girls Diastolic BP Percentile --      Boys Systolic BP Percentile --      Boys Diastolic BP Percentile --      Pulse Rate 09/26/24 0851 (!) 133     Resp 09/26/24 0851 20     Temp 09/26/24 0851 (!) 100.5 F (38.1 C)     Temp src --      SpO2 09/26/24 0851 97 %     Weight --      Height --      Head Circumference --      Peak Flow --      Pain Score 09/26/24 0853 0     Pain Loc --      Pain Education --      Exclude from Growth Chart --    No data found.  Updated Vital Signs BP 127/75 (BP Location: Right Arm)   Pulse (!) 133   Temp (!) 100.5 F (38.1 C)   Resp 20   LMP 09/15/2024 (Approximate)   SpO2 97%   Visual Acuity Right Eye Distance:   Left Eye Distance:   Bilateral Distance:    Right Eye Near:   Left Eye Near:    Bilateral Near:     Physical Exam Vitals and nursing note reviewed.  Constitutional:       Appearance: Normal appearance.  HENT:     Head: Atraumatic.     Right Ear: Tympanic membrane and external ear normal.     Left Ear: Tympanic membrane and external ear normal.     Nose: Rhinorrhea present.     Mouth/Throat:     Mouth: Mucous membranes are moist.     Pharynx: No posterior oropharyngeal erythema.  Eyes:     Extraocular Movements: Extraocular movements intact.     Conjunctiva/sclera: Conjunctivae normal.  Cardiovascular:     Rate and Rhythm: Regular rhythm. Tachycardia present.     Heart sounds: Normal heart sounds.  Pulmonary:     Effort: Pulmonary effort is normal. No respiratory distress.     Breath sounds: Normal breath sounds. No wheezing or rales.  Musculoskeletal:        General: Normal range of motion.     Cervical back: Normal range of motion and neck supple.  Skin:    General: Skin is warm and dry.  Neurological:     Mental Status: She is alert and oriented to person, place, and time.  Psychiatric:        Mood and Affect: Mood normal.        Thought Content: Thought content normal.      UC Treatments / Results  Labs (all labs ordered are listed, but only abnormal results are displayed) Labs Reviewed  POC SOFIA SARS ANTIGEN FIA  POCT INFLUENZA A/B    EKG   Radiology No results found.  Procedures Procedures (including critical care time)  Medications Ordered in UC Medications - No data to display  Initial Impression / Assessment and Plan / UC Course  I have reviewed the triage vital signs and the nursing notes.  Pertinent labs & imaging results that were available during my care of the patient were reviewed by me and considered in my medical decision making (see chart for details).     Febrile and tachycardic in triage, otherwise vital signs reassuring.  Rapid flu and COVID-negative.  Suspect other viral illness causing symptoms.  Treat with Phenergan  DM, prednisone , Mucinex , Astelin , supportive over-the-counter medications and home  care.  Return  for worsening or unresolving symptoms.  Final Clinical Impressions(s) / UC Diagnoses   Final diagnoses:  Viral URI with cough  Fever, unspecified     Discharge Instructions      I have sent over a steroid to help with your cough and ongoing symptoms.  I also sent in a nasal spray, Mucinex  and a cough syrup.  Keep the fevers under control with ibuprofen  and Tylenol  as needed, stay well-hydrated, get plenty of rest.  Follow-up for worsening or unresolving symptoms.    ED Prescriptions     Medication Sig Dispense Auth. Provider   promethazine -dextromethorphan (PROMETHAZINE -DM) 6.25-15 MG/5ML syrup Take 5 mLs by mouth 4 (four) times daily as needed. 100 mL Stuart Vernell Norris, PA-C   predniSONE  (DELTASONE ) 20 MG tablet Take 2 tablets (40 mg total) by mouth daily with breakfast. 10 tablet Stuart Vernell Norris, PA-C   guaiFENesin  (MUCINEX ) 600 MG 12 hr tablet Take 1 tablet (600 mg total) by mouth 2 (two) times daily. 20 tablet Stuart Vernell Norris, PA-C   azelastine  (ASTELIN ) 0.1 % nasal spray Place 1 spray into both nostrils 2 (two) times daily. Use in each nostril as directed 30 mL Stuart Vernell Norris, PA-C      PDMP not reviewed this encounter.    [1]  Social History Tobacco Use   Smoking status: Former    Current packs/day: 0.00    Types: Cigarettes    Quit date: 12/13/2014    Years since quitting: 9.7   Smokeless tobacco: Never  Substance Use Topics   Alcohol use: No    Comment: beer before pregnancy   Drug use: No     Stuart Vernell Norris, PA-C 09/26/24 9057  "

## 2024-09-26 NOTE — ED Triage Notes (Signed)
 Pt reports cough, congestion, fever, headache x 5 days

## 2024-09-26 NOTE — Discharge Instructions (Signed)
 I have sent over a steroid to help with your cough and ongoing symptoms.  I also sent in a nasal spray, Mucinex  and a cough syrup.  Keep the fevers under control with ibuprofen  and Tylenol  as needed, stay well-hydrated, get plenty of rest.  Follow-up for worsening or unresolving symptoms.
